# Patient Record
Sex: Female | Born: 1955 | Race: White | Hispanic: No | Smoking: Current every day smoker
Health system: Southern US, Community
[De-identification: ages and names within clinical notes are randomized; demographics above are authoritative.]

## PROBLEM LIST (undated history)

## (undated) DIAGNOSIS — Z972 Presence of dental prosthetic device (complete) (partial): Secondary | ICD-10-CM

## (undated) DIAGNOSIS — F338 Other recurrent depressive disorders: Secondary | ICD-10-CM

## (undated) DIAGNOSIS — M797 Fibromyalgia: Secondary | ICD-10-CM

## (undated) DIAGNOSIS — A6920 Lyme disease, unspecified: Secondary | ICD-10-CM

## (undated) DIAGNOSIS — R5382 Chronic fatigue, unspecified: Secondary | ICD-10-CM

## (undated) DIAGNOSIS — IMO0001 Reserved for inherently not codable concepts without codable children: Secondary | ICD-10-CM

## (undated) DIAGNOSIS — K219 Gastro-esophageal reflux disease without esophagitis: Secondary | ICD-10-CM

## (undated) DIAGNOSIS — K589 Irritable bowel syndrome without diarrhea: Secondary | ICD-10-CM

## (undated) DIAGNOSIS — M1990 Unspecified osteoarthritis, unspecified site: Secondary | ICD-10-CM

## (undated) DIAGNOSIS — E039 Hypothyroidism, unspecified: Secondary | ICD-10-CM

## (undated) DIAGNOSIS — F419 Anxiety disorder, unspecified: Secondary | ICD-10-CM

## (undated) HISTORY — PX: APPENDECTOMY: SHX54

## (undated) HISTORY — PX: BACK SURGERY: SHX140

## (undated) HISTORY — PX: BREAST SURGERY: SHX581

## (undated) HISTORY — PX: FOOT SURGERY: SHX648

---

## 1982-08-19 HISTORY — PX: ABDOMINAL HYSTERECTOMY: SHX81

## 2004-08-24 ENCOUNTER — Ambulatory Visit: Payer: Self-pay | Admitting: Otolaryngology

## 2004-08-29 ENCOUNTER — Ambulatory Visit: Payer: Self-pay | Admitting: Otolaryngology

## 2004-09-10 ENCOUNTER — Emergency Department: Payer: Self-pay | Admitting: Emergency Medicine

## 2004-09-28 ENCOUNTER — Emergency Department: Payer: Self-pay | Admitting: Unknown Physician Specialty

## 2004-10-04 ENCOUNTER — Inpatient Hospital Stay: Payer: Self-pay | Admitting: Gastroenterology

## 2005-02-13 ENCOUNTER — Ambulatory Visit: Payer: Self-pay | Admitting: Family Medicine

## 2005-07-31 ENCOUNTER — Emergency Department: Payer: Self-pay | Admitting: Emergency Medicine

## 2005-09-16 ENCOUNTER — Ambulatory Visit: Payer: Self-pay | Admitting: Gastroenterology

## 2005-09-30 ENCOUNTER — Ambulatory Visit: Payer: Self-pay | Admitting: Gastroenterology

## 2006-04-24 ENCOUNTER — Ambulatory Visit: Payer: Self-pay | Admitting: Nurse Practitioner

## 2006-06-26 ENCOUNTER — Ambulatory Visit: Payer: Self-pay | Admitting: Nurse Practitioner

## 2006-08-19 HISTORY — PX: SEPTOPLASTY: SUR1290

## 2007-05-07 ENCOUNTER — Ambulatory Visit: Payer: Self-pay | Admitting: Family Medicine

## 2007-05-19 ENCOUNTER — Emergency Department: Payer: Self-pay | Admitting: Emergency Medicine

## 2007-05-20 ENCOUNTER — Emergency Department: Payer: Self-pay | Admitting: Emergency Medicine

## 2007-05-21 ENCOUNTER — Ambulatory Visit: Payer: Self-pay | Admitting: *Deleted

## 2007-05-28 ENCOUNTER — Ambulatory Visit: Payer: Self-pay | Admitting: Unknown Physician Specialty

## 2008-10-20 ENCOUNTER — Ambulatory Visit: Payer: Self-pay | Admitting: Gastroenterology

## 2009-07-11 ENCOUNTER — Ambulatory Visit: Payer: Self-pay | Admitting: Internal Medicine

## 2009-11-08 ENCOUNTER — Emergency Department: Payer: Self-pay | Admitting: Emergency Medicine

## 2010-07-19 ENCOUNTER — Ambulatory Visit: Payer: Self-pay | Admitting: Internal Medicine

## 2010-10-17 ENCOUNTER — Ambulatory Visit: Payer: Self-pay | Admitting: Pain Medicine

## 2011-02-27 ENCOUNTER — Ambulatory Visit: Payer: Self-pay | Admitting: Otolaryngology

## 2011-03-12 ENCOUNTER — Ambulatory Visit: Payer: Self-pay | Admitting: Otolaryngology

## 2011-05-10 ENCOUNTER — Ambulatory Visit: Payer: Self-pay | Admitting: Gastroenterology

## 2011-08-06 ENCOUNTER — Ambulatory Visit: Payer: Self-pay | Admitting: Internal Medicine

## 2012-08-10 ENCOUNTER — Ambulatory Visit: Payer: Self-pay | Admitting: Internal Medicine

## 2012-08-13 ENCOUNTER — Ambulatory Visit: Payer: Self-pay | Admitting: Internal Medicine

## 2013-08-16 ENCOUNTER — Ambulatory Visit: Payer: Self-pay | Admitting: Internal Medicine

## 2013-09-29 ENCOUNTER — Emergency Department: Payer: Self-pay | Admitting: Emergency Medicine

## 2013-09-29 LAB — TROPONIN I: Troponin-I: <0.02 ng/mL

## 2013-09-29 LAB — CBC
HCT: 38.3 % (ref 35.0–47.0)
HGB: 13 g/dL (ref 12.0–16.0)
MCH: 30 pg (ref 26.0–34.0)
MCHC: 33.9 g/dL (ref 32.0–36.0)
MCV: 88 fL (ref 80–100)
Platelet: 228 10*3/uL (ref 150–440)
RBC: 4.34 10*6/uL (ref 3.80–5.20)
RDW: 13.3 % (ref 11.5–14.5)
WBC: 7.6 10*3/uL (ref 3.6–11.0)

## 2013-09-29 LAB — URINALYSIS, COMPLETE
BILIRUBIN, UR: NEGATIVE
Bacteria: NONE SEEN
Blood: NEGATIVE
Glucose,UR: NEGATIVE mg/dL (ref 0–75)
Ketone: NEGATIVE
LEUKOCYTE ESTERASE: NEGATIVE
Nitrite: NEGATIVE
Ph: 6 (ref 4.5–8.0)
Protein: NEGATIVE
RBC,UR: <1 /HPF (ref 0–5)
Specific Gravity: 1.002 (ref 1.003–1.030)
Squamous Epithelial: <1

## 2013-09-29 LAB — COMPREHENSIVE METABOLIC PANEL
ANION GAP: 4 — AB (ref 7–16)
Albumin: 4.2 g/dL (ref 3.4–5.0)
Alkaline Phosphatase: 70 U/L
BILIRUBIN TOTAL: 0.2 mg/dL (ref 0.2–1.0)
BUN: 7 mg/dL (ref 7–18)
CHLORIDE: 108 mmol/L — AB (ref 98–107)
CREATININE: 0.82 mg/dL (ref 0.60–1.30)
Calcium, Total: 8.9 mg/dL (ref 8.5–10.1)
Co2: 25 mmol/L (ref 21–32)
EGFR (African American): >60
EGFR (Non-African Amer.): >60
GLUCOSE: 93 mg/dL (ref 65–99)
Osmolality: 271 (ref 275–301)
Potassium: 3.5 mmol/L (ref 3.5–5.1)
SGOT(AST): 48 U/L — ABNORMAL HIGH (ref 15–37)
SGPT (ALT): 27 U/L (ref 12–78)
SODIUM: 137 mmol/L (ref 136–145)
TOTAL PROTEIN: 7.7 g/dL (ref 6.4–8.2)

## 2013-09-30 LAB — LIPASE, BLOOD: Lipase: 129 U/L (ref 73–393)

## 2014-08-23 ENCOUNTER — Ambulatory Visit: Payer: Self-pay | Admitting: Internal Medicine

## 2015-10-12 ENCOUNTER — Other Ambulatory Visit: Payer: Self-pay | Admitting: Otolaryngology

## 2015-10-12 DIAGNOSIS — E041 Nontoxic single thyroid nodule: Secondary | ICD-10-CM

## 2015-10-18 ENCOUNTER — Ambulatory Visit
Admission: RE | Admit: 2015-10-18 | Discharge: 2015-10-18 | Disposition: A | Discharge status: Home / Self Care | Payer: Medicare Other | Source: Ambulatory Visit | Attending: Otolaryngology | Admitting: Otolaryngology

## 2015-10-18 DIAGNOSIS — E041 Nontoxic single thyroid nodule: Secondary | ICD-10-CM

## 2015-10-23 ENCOUNTER — Encounter: Payer: Self-pay | Admitting: *Deleted

## 2015-10-31 ENCOUNTER — Ambulatory Visit
Admission: RE | Admit: 2015-10-31 | Discharge: 2015-10-31 | Disposition: A | Discharge status: Home / Self Care | Payer: Medicare Other | Source: Ambulatory Visit | Attending: Otolaryngology | Admitting: Otolaryngology

## 2015-10-31 ENCOUNTER — Ambulatory Visit: Payer: Medicare Other | Admitting: Anesthesiology

## 2015-10-31 ENCOUNTER — Encounter
Admission: RE | Disposition: A | Discharge status: Home / Self Care | Payer: Self-pay | Source: Ambulatory Visit | Attending: Otolaryngology

## 2015-10-31 DIAGNOSIS — Z79899 Other long term (current) drug therapy: Secondary | ICD-10-CM | POA: Diagnosis not present

## 2015-10-31 DIAGNOSIS — K219 Gastro-esophageal reflux disease without esophagitis: Secondary | ICD-10-CM | POA: Diagnosis not present

## 2015-10-31 DIAGNOSIS — Z888 Allergy status to other drugs, medicaments and biological substances status: Secondary | ICD-10-CM | POA: Insufficient documentation

## 2015-10-31 DIAGNOSIS — Z9889 Other specified postprocedural states: Secondary | ICD-10-CM | POA: Insufficient documentation

## 2015-10-31 DIAGNOSIS — Z90710 Acquired absence of both cervix and uterus: Secondary | ICD-10-CM | POA: Diagnosis not present

## 2015-10-31 DIAGNOSIS — F1920 Other psychoactive substance dependence, uncomplicated: Secondary | ICD-10-CM | POA: Insufficient documentation

## 2015-10-31 DIAGNOSIS — Z880 Allergy status to penicillin: Secondary | ICD-10-CM | POA: Diagnosis not present

## 2015-10-31 DIAGNOSIS — Z7951 Long term (current) use of inhaled steroids: Secondary | ICD-10-CM | POA: Insufficient documentation

## 2015-10-31 DIAGNOSIS — L720 Epidermal cyst: Secondary | ICD-10-CM | POA: Diagnosis not present

## 2015-10-31 DIAGNOSIS — J3501 Chronic tonsillitis: Secondary | ICD-10-CM | POA: Insufficient documentation

## 2015-10-31 DIAGNOSIS — J358 Other chronic diseases of tonsils and adenoids: Secondary | ICD-10-CM | POA: Insufficient documentation

## 2015-10-31 DIAGNOSIS — Z882 Allergy status to sulfonamides status: Secondary | ICD-10-CM | POA: Insufficient documentation

## 2015-10-31 DIAGNOSIS — M1990 Unspecified osteoarthritis, unspecified site: Secondary | ICD-10-CM | POA: Insufficient documentation

## 2015-10-31 DIAGNOSIS — M797 Fibromyalgia: Secondary | ICD-10-CM | POA: Diagnosis not present

## 2015-10-31 DIAGNOSIS — E079 Disorder of thyroid, unspecified: Secondary | ICD-10-CM | POA: Insufficient documentation

## 2015-10-31 DIAGNOSIS — Z9049 Acquired absence of other specified parts of digestive tract: Secondary | ICD-10-CM | POA: Diagnosis not present

## 2015-10-31 DIAGNOSIS — J351 Hypertrophy of tonsils: Secondary | ICD-10-CM | POA: Diagnosis not present

## 2015-10-31 DIAGNOSIS — K589 Irritable bowel syndrome without diarrhea: Secondary | ICD-10-CM | POA: Insufficient documentation

## 2015-10-31 DIAGNOSIS — Z886 Allergy status to analgesic agent status: Secondary | ICD-10-CM | POA: Insufficient documentation

## 2015-10-31 DIAGNOSIS — F17200 Nicotine dependence, unspecified, uncomplicated: Secondary | ICD-10-CM | POA: Diagnosis not present

## 2015-10-31 DIAGNOSIS — R5382 Chronic fatigue, unspecified: Secondary | ICD-10-CM | POA: Diagnosis not present

## 2015-10-31 DIAGNOSIS — Z79891 Long term (current) use of opiate analgesic: Secondary | ICD-10-CM | POA: Insufficient documentation

## 2015-10-31 DIAGNOSIS — F1020 Alcohol dependence, uncomplicated: Secondary | ICD-10-CM | POA: Diagnosis not present

## 2015-10-31 DIAGNOSIS — L723 Sebaceous cyst: Secondary | ICD-10-CM | POA: Diagnosis present

## 2015-10-31 HISTORY — DX: Unspecified osteoarthritis, unspecified site: M19.90

## 2015-10-31 HISTORY — DX: Lyme disease, unspecified: A69.20

## 2015-10-31 HISTORY — DX: Irritable bowel syndrome, unspecified: K58.9

## 2015-10-31 HISTORY — DX: Presence of dental prosthetic device (complete) (partial): Z97.2

## 2015-10-31 HISTORY — DX: Chronic fatigue, unspecified: R53.82

## 2015-10-31 HISTORY — DX: Reserved for inherently not codable concepts without codable children: IMO0001

## 2015-10-31 HISTORY — PX: PREAURICULAR CYST EXCISION: SHX2264

## 2015-10-31 HISTORY — DX: Gastro-esophageal reflux disease without esophagitis: K21.9

## 2015-10-31 HISTORY — DX: Fibromyalgia: M79.7

## 2015-10-31 HISTORY — PX: TONSILLECTOMY: SHX5217

## 2015-10-31 SURGERY — TONSILLECTOMY
Anesthesia: General | Wound class: Clean Contaminated

## 2015-10-31 MED ORDER — FENTANYL CITRATE (PF) 100 MCG/2ML IJ SOLN: 25.0000 ug | INTRAMUSCULAR | Status: DC | PRN

## 2015-10-31 MED ORDER — SUCCINYLCHOLINE CHLORIDE 20 MG/ML IJ SOLN
INTRAMUSCULAR | Status: DC | PRN
  Administered 2015-10-31: 100 mg via INTRAVENOUS

## 2015-10-31 MED ORDER — FENTANYL CITRATE (PF) 100 MCG/2ML IJ SOLN
INTRAMUSCULAR | Status: DC | PRN
  Administered 2015-10-31: 100 ug via INTRAVENOUS

## 2015-10-31 MED ORDER — ONDANSETRON HCL 4 MG/2ML IJ SOLN
INTRAMUSCULAR | Status: DC | PRN
  Administered 2015-10-31: 4 mg via INTRAVENOUS

## 2015-10-31 MED ORDER — PROPOFOL 10 MG/ML IV BOLUS
INTRAVENOUS | Status: DC | PRN
  Administered 2015-10-31: 200 mg via INTRAVENOUS

## 2015-10-31 MED ORDER — ACETAMINOPHEN 10 MG/ML IV SOLN
1000.0000 mg | Freq: Once | INTRAVENOUS | Status: AC
  Administered 2015-10-31: 1000 mg via INTRAVENOUS

## 2015-10-31 MED ORDER — OXYCODONE-ACETAMINOPHEN 5-325 MG/5ML PO SOLN: ORAL | Status: DC

## 2015-10-31 MED ORDER — GLYCOPYRROLATE 0.2 MG/ML IJ SOLN
INTRAMUSCULAR | Status: DC | PRN
  Administered 2015-10-31: 0.1 mg via INTRAVENOUS

## 2015-10-31 MED ORDER — LACTATED RINGERS IV SOLN
INTRAVENOUS | Status: DC
  Administered 2015-10-31: 11:00:00 via INTRAVENOUS

## 2015-10-31 MED ORDER — BUPIVACAINE-EPINEPHRINE 0.25% -1:200000 IJ SOLN
INTRAMUSCULAR | Status: DC | PRN
  Administered 2015-10-31: 2 mL

## 2015-10-31 MED ORDER — MIDAZOLAM HCL 5 MG/5ML IJ SOLN
INTRAMUSCULAR | Status: DC | PRN
  Administered 2015-10-31: 2 mg via INTRAVENOUS

## 2015-10-31 MED ORDER — OXYCODONE HCL 5 MG/5ML PO SOLN
5.0000 mg | Freq: Once | ORAL | Status: AC | PRN
  Administered 2015-10-31: 5 mg via ORAL

## 2015-10-31 MED ORDER — PREDNISOLONE SODIUM PHOSPHATE 15 MG/5ML PO SOLN: ORAL | Status: DC

## 2015-10-31 MED ORDER — LIDOCAINE HCL (CARDIAC) 20 MG/ML IV SOLN
INTRAVENOUS | Status: DC | PRN
  Administered 2015-10-31: 40 mg via INTRAVENOUS

## 2015-10-31 MED ORDER — BACIT-POLY-NEO HC 1 % EX OINT
TOPICAL_OINTMENT | CUTANEOUS | Status: DC | PRN
  Administered 2015-10-31: 1 via TOPICAL

## 2015-10-31 MED ORDER — OXYCODONE HCL 5 MG PO TABS: 5.0000 mg | ORAL_TABLET | Freq: Once | ORAL | Status: AC | PRN

## 2015-10-31 MED ORDER — ONDANSETRON HCL 4 MG/2ML IJ SOLN
4.0000 mg | Freq: Once | INTRAMUSCULAR | Status: AC | PRN
  Administered 2015-10-31: 4 mg via INTRAVENOUS

## 2015-10-31 MED ORDER — LIDOCAINE HCL (PF) 1 % IJ SOLN
INTRAMUSCULAR | Status: DC | PRN
  Administered 2015-10-31: 1 mL

## 2015-10-31 MED ORDER — DEXAMETHASONE SODIUM PHOSPHATE 4 MG/ML IJ SOLN
INTRAMUSCULAR | Status: DC | PRN
  Administered 2015-10-31: 8 mg via INTRAVENOUS

## 2015-10-31 SURGICAL SUPPLY — 37 items
APPLICATOR COTTON TIP WD 3 STR (MISCELLANEOUS) IMPLANT
BLADE SURG 15 STRL LF DISP TIS (BLADE) IMPLANT
BLADE SURG 15 STRL SS (BLADE)
CANISTER SUCT 1200ML W/VALVE (MISCELLANEOUS) ×4 IMPLANT
CATH ROBINSON RED A/P 10FR (CATHETERS) ×4 IMPLANT
COAG SUCT 10F 3.5MM HAND CTRL (MISCELLANEOUS) ×4 IMPLANT
CORD BIP STRL DISP 12FT (MISCELLANEOUS) ×4 IMPLANT
DECANTER SPIKE VIAL GLASS SM (MISCELLANEOUS) ×4 IMPLANT
DRAPE HEAD BAR (DRAPES) IMPLANT
DRESSING TELFA 4X3 1S ST N-ADH (GAUZE/BANDAGES/DRESSINGS) ×4 IMPLANT
ELECT CAUTERY BLADE TIP 2.5 (TIP) ×4
ELECT CAUTERY NEEDLE 2.0 MIC (NEEDLE) IMPLANT
ELECTRODE CAUTERY BLDE TIP 2.5 (TIP) ×2 IMPLANT
GAUZE SPONGE 4X4 12PLY STRL (GAUZE/BANDAGES/DRESSINGS) IMPLANT
GLOVE BIO SURGEON STRL SZ7.5 (GLOVE) ×8 IMPLANT
KIT ROOM TURNOVER OR (KITS) ×4 IMPLANT
LIQUID BAND (GAUZE/BANDAGES/DRESSINGS) IMPLANT
NEEDLE HYPO 25GX1X1/2 BEV (NEEDLE) ×4 IMPLANT
NS IRRIG 500ML POUR BTL (IV SOLUTION) ×4 IMPLANT
PACK DRAPE NASAL/ENT (PACKS) ×4 IMPLANT
PACK TONSIL/ADENOIDS (PACKS) ×4 IMPLANT
PAD GROUND ADULT SPLIT (MISCELLANEOUS) ×4 IMPLANT
PENCIL ELECTRO HAND CTR (MISCELLANEOUS) ×4 IMPLANT
SOL ANTI-FOG 6CC FOG-OUT (MISCELLANEOUS) ×2 IMPLANT
SOL FOG-OUT ANTI-FOG 6CC (MISCELLANEOUS) ×2
SOL PREP PVP 2OZ (MISCELLANEOUS)
SOLUTION PREP PVP 2OZ (MISCELLANEOUS) IMPLANT
STRAP BODY AND KNEE 60X3 (MISCELLANEOUS) ×4 IMPLANT
SUCTION FRAZIER TIP 10 FR DISP (SUCTIONS) IMPLANT
SUT PLAIN GUT FAST 5-0 (SUTURE) ×4 IMPLANT
SUT PROLENE 5 0 P 3 (SUTURE) IMPLANT
SUT VIC AB 4-0 RB1 27 (SUTURE)
SUT VIC AB 4-0 RB1 27X BRD (SUTURE) IMPLANT
SUT VIC AB 5-0 P-3 18X BRD (SUTURE) ×2 IMPLANT
SUT VIC AB 5-0 P3 18 (SUTURE) ×2
SYRINGE 10CC LL (SYRINGE) ×4 IMPLANT
TOWEL OR 17X26 4PK STRL BLUE (TOWEL DISPOSABLE) ×4 IMPLANT

## 2015-10-31 NOTE — H&P (Signed)
History and physical reviewed and will be scanned in later. No change in medical status reported by the patient or family, appears stable for surgery. All questions regarding the procedure answered, and patient (or family if a child) expressed understanding of the procedure.  Judy Salinas S @TODAY@ 

## 2015-10-31 NOTE — Anesthesia Preprocedure Evaluation (Signed)
Anesthesia Evaluation  Patient identified by MRN, date of birth, ID band Patient awake    Reviewed: Allergy & Precautions, H&P , NPO status   Airway Mallampati: II  TM Distance: >3 FB Neck ROM: full    Dental   Pulmonary shortness of breath, Current Smoker,     + wheezing      Cardiovascular Normal cardiovascular exam     Neuro/Psych PSYCHIATRIC DISORDERS  Neuromuscular disease    GI/Hepatic GERD  ,  Endo/Other    Renal/GU      Musculoskeletal   Abdominal   Peds  Hematology   Anesthesia Other Findings   Reproductive/Obstetrics                             Anesthesia Physical Anesthesia Plan  ASA: III  Anesthesia Plan: General ETT   Post-op Pain Management:    Induction:   Airway Management Planned:   Additional Equipment:   Intra-op Plan:   Post-operative Plan:   Informed Consent: I have reviewed the patients History and Physical, chart, labs and discussed the procedure including the risks, benefits and alternatives for the proposed anesthesia with the patient or authorized representative who has indicated his/her understanding and acceptance.     Plan Discussed with: CRNA  Anesthesia Plan Comments:         Anesthesia Quick Evaluation

## 2015-10-31 NOTE — Discharge Instructions (Signed)
T & A INSTRUCTION SHEET - [facility] °[facility] ° °CREIGHTON VAUGHT, MD °PAUL H. JUENGEL, MD  °P. SCOTT BENNETT °CHAPMAN MCQUEEN, MD ° °[address] [place], [place] [address] TEL. [phone] °[address] [place] [address] [phone] ° °INFORMATION SHEET FOR A TONSILLECTOMY AND ADENDOIDECTOMY ° °About Your Tonsils and Adenoids ° The tonsils and adenoids are normal body tissues that are part of our immune system.  They normally help to protect us against diseases that may enter our mouth and nose.  However, sometimes the tonsils and/or adenoids become too large and obstruct our breathing, especially at night. °  ° If either of these things happen it helps to remove the tonsils and adenoids in order to become healthier. The operation to remove the tonsils and adenoids is called a tonsillectomy and adenoidectomy. ° °The Location of Your Tonsils and Adenoids ° The tonsils are located in the back of the throat on both side and sit in a cradle of muscles. The adenoids are located in the roof of the mouth, behind the nose, and closely associated with the opening of the Eustachian tube to the ear. ° °Surgery on Tonsils and Adenoids ° A tonsillectomy and adenoidectomy is a short operation which takes about thirty minutes.  This includes being put to sleep and being awakened.  Tonsillectomies and adenoidectomies are performed at [facility] and may require observation period in the recovery room prior to going home. ° °Following the Operation for a Tonsillectomy ° A cautery machine is used to control bleeding.  Bleeding from a tonsillectomy and adenoidectomy is minimal and postoperatively the risk of bleeding is approximately four percent, although this rarely life threatening. ° ° ° °After your tonsillectomy and adenoidectomy post-op care at home: ° °1. Our patients are able to go home the same day.  You may be given prescriptions for pain  medications and antibiotics, if indicated. °2. It is extremely important to remember that fluid intake is of utmost importance after a tonsillectomy.  The amount that you drink must be maintained in the postoperative period.  A good indication of whether a child is getting enough fluid is whether his/her urine output is constant.  As long as children are urinating or wetting their diaper every 6 - 8 hours this is usually enough fluid intake.   °3. Although rare, this is a risk of some bleeding in the first ten days after surgery.  This is usually occurs between day five and nine postoperatively.  This risk of bleeding is approximately four percent.  If you or your child should have any bleeding you should remain calm and notify our office or go directly to the Emergency Room at [facility] where they will contact us. Our doctors are available seven days a week for notification.  We recommend sitting up quietly in a chair, place an ice pack on the front of the neck and spitting out the blood gently until we are able to contact you.  Adults should gargle gently with ice water and this may help stop the bleeding.  If the bleeding does not stop after a short time, i.e. 10 to 15 minutes, or seems to be increasing again, please contact us or go to the hospital.   °4. It is common for the pain to be worse at 5 - 7 days postoperatively.  This occurs because the “scab” is peeling off and the mucous membrane (skin of   the throat) is growing back where the tonsils were.   °5. It is common for a low-grade fever, less than 102, during the first week after a tonsillectomy and adenoidectomy.  It is usually due to not drinking enough liquids, and we suggest your use liquid Tylenol or the pain medicine with Tylenol prescribed in order to keep your temperature below 102.  Please follow the directions on the back of the bottle. °6. Do not take aspirin or any products that contain aspirin such as Bufferin, Anacin,  Ecotrin, aspirin gum, Goodies, BC headache powders, etc., after a T&A because it can promote bleeding.  Please check with our office before administering any other medication that may been prescribed by other doctors during the two week post-operative period. °7. If you happen to look in the mirror or into your child’s mouth you will see white/gray patches on the back of the throat.  This is what a scab looks like in the mouth and is normal after having a T&A.  It will disappear once the tonsil area heals completely. However, it may cause a noticeable odor, and this too will disappear with time.     °8. You or your child may experience ear pain after having a T&A.  This is called referred pain and comes from the throat, but it is felt in the ears.  Ear pain is quite common and expected.  It will usually go away after ten days.  There is usually nothing wrong with the ears, and it is primarily due to the healing area stimulating the nerve to the ear that runs along the side of the throat.  Use either the prescribed pain medicine or Tylenol as needed.  °9. The throat tissues after a tonsillectomy are obviously sensitive.  Smoking around children who have had a tonsillectomy significantly increases the risk of bleeding.  DO NOT SMOKE!  ° °General Anesthesia, Adult, Care After °Refer to this sheet in the next few weeks. These instructions provide you with information on caring for yourself after your procedure. Your health care provider may also give you more specific instructions. Your treatment has been planned according to current medical practices, but problems sometimes occur. Call your health care provider if you have any problems or questions after your procedure. °WHAT TO EXPECT AFTER THE PROCEDURE °After the procedure, it is typical to experience: °· Sleepiness. °· Nausea and vomiting. °HOME CARE INSTRUCTIONS °· For the first 24 hours after general anesthesia: °¨ Have a responsible person with you. °¨ Do not  drive a car. If you are alone, do not take public transportation. °¨ Do not drink alcohol. °¨ Do not take medicine that has not been prescribed by your health care provider. °¨ Do not sign important papers or make important decisions. °¨ You may resume a normal diet and activities as directed by your health care provider. °· Change bandages (dressings) as directed. °· If you have questions or problems that seem related to general anesthesia, call the hospital and ask for the anesthetist or anesthesiologist on call. °SEEK MEDICAL CARE IF: °· You have nausea and vomiting that continue the day after anesthesia. °· You develop a rash. °SEEK IMMEDIATE MEDICAL CARE IF:  °· You have difficulty breathing. °· You have chest pain. °· You have any allergic problems. °  °This information is not intended to replace advice given to you by your health care provider. Make sure you discuss any questions you have with your health care provider. °  °Document   Released: 11/11/2000 Document Revised: 08/26/2014 Document Reviewed: 12/04/2011 °Elsevier Interactive Patient Education ©2016 [employer] ° °

## 2015-10-31 NOTE — Op Note (Signed)
10/31/2015  1:29 PM    Judy Salinas  [id number]   Pre-Op Diagnosis:  CHRONIC TONSILLITIS, TONSILLITHIASIS, ASYMMETRIC TONSIL HYPERTROPHY, LEFT POST AURICULAR CYST  Post-op Diagnosis: SAME  Procedure: 1) Tonsillectomy 2) excision of 7 mm left posterior auricular sebaceous cyst with intermediate closure (1.5 cm)  Surgeon:  Riley Nearing  Anesthesia:  General endotracheal  EBL:  Less than 25 cc  Complications:  None  Findings: 7 mm sebaceous cyst involving the left postauricular skin on the auricle. 2+ cryptic tonsils with the left tonsil asymmetrically enlarged compared to the right. Bilateral tonsillith debris.  Procedure: The patient was taken to the Operating Room and placed in the supine position.  After induction of general endotracheal anesthesia, the table was turned 90 degrees and the left ear was prepped and draped in usual sterile fashion. 1% lidocaine with epinephrine 1 100,000 was injected in the skin on the posterior aspect of the left auricle around the cyst. A 15 blade was then used to incise an ellipse around the cyst, and the cyst excised completely using tenotomy's. Minor bleeding was controlled with bipolar cautery. The wound was then closed in a layered fashion with 5-0 Vicryl suture for the deep closure and 5-0 fast absorbing gut suture in a running locked stitch for the skin. Triple antibiotic ointment was then applied.   Next a mouth gag was inserted into the oral cavity to open the mouth, and examination of the oropharynx showed the uvula was non-bifid. The palate was palpated, and there was no evidence of submucous cleft. Examination of the nasopharynx showed no obstructing adenoids. The right tonsil was grasped with an Allis clamp and resected from the tonsillar fossa in the usual fashion with the Bovie. The left tonsil was resected in the same fashion. The Bovie was used to obtain hemostasis. Each tonsillar fossa was then carefully injected with 0.25%  marcaine with epinephrine, 1:200,000, avoiding intravascular injection. The nose and throat were irrigated and suctioned to remove any blood clot. The mouth gag was  removed with no evidence of active bleeding.  The patient was then returned to the anesthesiologist for awakening, and was taken to the Recovery Room in stable condition.  Cultures:  None.  Specimens:  Tonsils.  Disposition:   PACU to home  Plan: Soft, bland diet and push fluids. Take pain medications and antibiotics as prescribed. Antibiotic ointment to postauricular wound twice daily for 5 days. No strenuous activity for 2 weeks. Follow-up in 3 weeks.  Riley Nearing 10/31/2015 1:29 PM

## 2015-10-31 NOTE — Transfer of Care (Signed)
Immediate Anesthesia Transfer of Care Note  Patient: Judy Salinas  Procedure(s) Performed: Procedure(s): TONSILLECTOMY (N/A) EXCISION POST AURICULAR CYST ADULT (Left)  Patient Location: PACU  Anesthesia Type: General ETT  Level of Consciousness: awake, alert  and patient cooperative  Airway and Oxygen Therapy: Patient Spontanous Breathing and Patient connected to supplemental oxygen  Post-op Assessment: Post-op Vital signs reviewed, Patient's Cardiovascular Status Stable, Respiratory Function Stable, Patent Airway and No signs of Nausea or vomiting  Post-op Vital Signs: Reviewed and stable  Complications: No apparent anesthesia complications

## 2015-10-31 NOTE — Anesthesia Procedure Notes (Signed)
Procedure Name: Intubation Date/Time: 10/31/2015 12:44 PM Performed by: Londell Moh Pre-anesthesia Checklist: Patient identified, Emergency Drugs available, Suction available, Patient being monitored and Timeout performed Patient Re-evaluated:Patient Re-evaluated prior to inductionOxygen Delivery Method: Circle system utilized Preoxygenation: Pre-oxygenation with 100% oxygen Intubation Type: IV induction Ventilation: Mask ventilation without difficulty Laryngoscope Size: Mac and 3 Grade View: Grade I Tube type: Oral Rae Tube size: 7.0 mm Number of attempts: 1 Placement Confirmation: ETT inserted through vocal cords under direct vision,  positive ETCO2 and breath sounds checked- equal and bilateral Tube secured with: Tape Dental Injury: Teeth and Oropharynx as per pre-operative assessment

## 2015-10-31 NOTE — Anesthesia Postprocedure Evaluation (Signed)
Anesthesia Post Note  Patient: Judy Salinas  Procedure(s) Performed: Procedure(s) (LRB): TONSILLECTOMY (N/A) EXCISION POST AURICULAR CYST ADULT (Left)  Patient location during evaluation: PACU Anesthesia Type: General Level of consciousness: awake and alert Pain management: pain level controlled Vital Signs Assessment: post-procedure vital signs reviewed and stable Respiratory status: spontaneous breathing, nonlabored ventilation, respiratory function stable and patient connected to nasal cannula oxygen Cardiovascular status: blood pressure returned to baseline and stable Postop Assessment: no signs of nausea or vomiting Anesthetic complications: no    Amaryllis Dyke

## 2015-11-01 ENCOUNTER — Encounter: Payer: Self-pay | Admitting: Otolaryngology

## 2015-11-02 LAB — SURGICAL PATHOLOGY

## 2016-04-15 ENCOUNTER — Ambulatory Visit (INDEPENDENT_AMBULATORY_CARE_PROVIDER_SITE_OTHER): Payer: Medicare Other | Admitting: Podiatry

## 2016-04-15 ENCOUNTER — Ambulatory Visit: Payer: Medicare Other

## 2016-04-15 ENCOUNTER — Ambulatory Visit (INDEPENDENT_AMBULATORY_CARE_PROVIDER_SITE_OTHER): Payer: Medicare Other

## 2016-04-15 ENCOUNTER — Encounter: Payer: Self-pay | Admitting: Podiatry

## 2016-04-15 DIAGNOSIS — M722 Plantar fascial fibromatosis: Secondary | ICD-10-CM

## 2016-04-15 NOTE — Progress Notes (Signed)
   Subjective:    Patient ID: Judy Salinas, female    DOB: Dec 18, 1955, 61 y.o.   MRN: [id number]  HPI: She presents today for a chief complaint of pain to the plantar medial aspect of the left heel she states that she walks about 5 miles a day. She is also complaining of pain to the second metatarsophalangeal joint bilaterally. The left foot we performed surgery on in this second toe was slightly contracted.    Review of Systems  HENT: Positive for ear pain and tinnitus.   Eyes: Positive for itching.  Gastrointestinal: Positive for abdominal distention, abdominal pain, constipation, diarrhea, nausea and vomiting.  Endocrine: Positive for cold intolerance.  Musculoskeletal: Positive for arthralgias, back pain, gait problem and myalgias.  Neurological: Positive for weakness.  Hematological: Bruises/bleeds easily.  All other systems reviewed and are negative.      Objective:   Physical Exam: Vital signs are stable alert and oriented 3 pulses are strongly palpable. Neurologic sensorium is intact to reflex are intact muscle strength is normal symmetrical bilateral. The evaluation demonstrates pain on end range of motion of the second metatarsophalangeal joints bilateral with contracture deformity of the skin and of the joint at the second metatarsophalangeal joint of the left foot secondary to surgery. Radiographs of the bilateral feet demonstrate soft tissue increase in density at the plantar fascia calcaneal insertion site of the left heel with internal fixations versus second metatarsal left foot. Capsulitis second metatarsophalangeal joint right foot. No fractures are identified.        Assessment & Plan:  Capsulitis second metatarsophalangeal joint bilateral plantar fasciitis left heel.  Plan: I injected the left heel today with Kenalog and local anesthetic injected second metatarsophalangeal joints after sterile Betadine skin prep dexamethasone and local anesthetic. Wrote  prescription for anti-inflammatory gel will follow-up with her in 1 month.

## 2016-09-04 ENCOUNTER — Ambulatory Visit: Payer: Medicare Other | Admitting: Podiatry

## 2016-09-16 ENCOUNTER — Ambulatory Visit (INDEPENDENT_AMBULATORY_CARE_PROVIDER_SITE_OTHER): Payer: Medicare Other

## 2016-09-16 ENCOUNTER — Ambulatory Visit (INDEPENDENT_AMBULATORY_CARE_PROVIDER_SITE_OTHER): Payer: Medicare Other | Admitting: Podiatry

## 2016-09-16 ENCOUNTER — Encounter: Payer: Self-pay | Admitting: Podiatry

## 2016-09-16 DIAGNOSIS — M2042 Other hammer toe(s) (acquired), left foot: Secondary | ICD-10-CM

## 2016-09-16 DIAGNOSIS — M722 Plantar fascial fibromatosis: Secondary | ICD-10-CM

## 2016-09-16 DIAGNOSIS — M778 Other enthesopathies, not elsewhere classified: Secondary | ICD-10-CM

## 2016-09-16 DIAGNOSIS — M7751 Other enthesopathy of right foot: Secondary | ICD-10-CM

## 2016-09-16 DIAGNOSIS — M779 Enthesopathy, unspecified: Principal | ICD-10-CM

## 2016-09-16 DIAGNOSIS — M2012 Hallux valgus (acquired), left foot: Secondary | ICD-10-CM | POA: Diagnosis not present

## 2016-09-16 NOTE — Progress Notes (Signed)
She presents today with a chief complaint of a painful second metatarsophalangeal joint of the left foot as well as a painful nodule to the plantar aspect of the right foot. She states that the nodule was much larger has since decreased in size and severity of pain. She states is still tender with pressure.  She relates having had surgery done by me approximately 10 years ago for bunion repair. We also worked on the second metatarsophalangeal joint and eventually remove the screw from the joint. She states that the toe is starting to sit up now and move over the big toe. She denies any changes in her past medical history medications or allergies she has an extensive allergy list of a proximally 14 allergies.  Objective: Vital signs are stable alert and oriented 3 I have reviewed her past medical history medications allergy surgery social history and review of systems. Pulses are strongly palpable. Neurologic sensorium is intact. She has a large nonpulsatile mass measuring 7 m in diameter medial band of plantar fascia consistent with a plantar fibroma. Radiographs of this lesion taken today do not demonstrate any calcification and normal osseous architecture otherwise this foot. Left foot does demonstrate pain on range of motion and on attempted range of motion of the second metatarsophalangeal joint of the left foot with a mild hallux interphalangeal. 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 There is no internal fixation remaining.  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 She also has plantar fibroma of the right foot.  Plan: We went over a consent form today line by line number by number giving her ample time to ask questions she saw fit regarding an Akin osteotomy a release of the second metatarsophalangeal joint hammertoe repair with pinning of the toe left and  injection of the plantar fibroma right foot. She understands this is amenable to it we did discuss the possible postop complications which may include but are not limited to postop pain bleeding swelling infection recurrence need for further surgery overcorrection under correction. She understands this is amenable to it and will follow up with me in the near future's. We did discuss the need for surgical intervention we also discussed surgery center and anesthesia. We dispensed a cam walker today.

## 2016-09-16 NOTE — Patient Instructions (Signed)
Pre-Operative Instructions  Congratulations, you have decided to take an important step to improving your quality of life.  You can be assured that the doctors of [facility] will be with you every step of the way.  1. Plan to be at the surgery center/hospital at least 1 (one) hour prior to your scheduled time unless otherwise directed by the surgical center/hospital staff.  You must have a responsible adult accompany you, remain during the surgery and drive you home.  Make sure you have directions to the surgical center/hospital and know how to get there on time. 2. For hospital based surgery you will need to obtain a history and physical form from your family physician within 1 month prior to the date of surgery- we will give you a form for you primary physician.  3. We make every effort to accommodate the date you request for surgery.  There are however, times where surgery dates or times have to be moved.  We will contact you as soon as possible if a change in schedule is required.   4. No Aspirin/Ibuprofen for one week before surgery.  If you are on aspirin, any non-steroidal anti-inflammatory medications (Mobic, Aleve, Ibuprofen) you should stop taking it 7 days prior to your surgery.  You make take Tylenol  For pain prior to surgery.  5. Medications- If you are taking daily heart and blood pressure medications, seizure, reflux, allergy, asthma, anxiety, pain or diabetes medications, make sure the surgery center/hospital is aware before the day of surgery so they may notify you which medications to take or avoid the day of surgery. 6. No food or drink after midnight the night before surgery unless directed otherwise by surgical center/hospital staff. 7. No alcoholic beverages 24 hours prior to surgery.  No smoking 24 hours prior to or 24 hours after surgery. 8. Wear loose pants or shorts- loose enough to fit over bandages, boots, and casts. 9. No slip on shoes, sneakers are best. 10. Bring  your boot with you to the surgery center/hospital.  Also bring crutches or a walker if your physician has prescribed it for you.  If you do not have this equipment, it will be provided for you after surgery. 11. If you have not been contracted by the surgery center/hospital by the day before your surgery, call to confirm the date and time of your surgery. 12. Leave-time from work may vary depending on the type of surgery you have.  Appropriate arrangements should be made prior to surgery with your employer. 13. Prescriptions will be provided immediately following surgery by your doctor.  Have these filled as soon as possible after surgery and take the medication as directed. 14. Remove nail polish on the operative foot. 15. Wash the night before surgery.  The night before surgery wash the foot and leg well with the antibacterial soap provided and water paying special attention to beneath the toenails and in between the toes.  Rinse thoroughly with water and dry well with a towel.  Perform this wash unless told not to do so by your physician.  Enclosed: 1 Ice pack (please put in freezer the night before surgery)   1 Hibiclens skin cleaner   Pre-op Instructions  If you have any questions regarding the instructions, do not hesitate to call our office.  [place]: [address] [place], [place] [address] [phone]  [place]: [address] [place], [place] [address] [phone]  [place]: [address]  [place], [place] [address] [phone]   Dr.   Norman Regal DPM, Dr. Matthew Wagoner DPM, Dr. M. Todd Kerstie Agent DPM, Dr. Titorya Stover DPM 

## 2016-09-17 ENCOUNTER — Telehealth: Payer: Self-pay | Admitting: *Deleted

## 2016-09-17 NOTE — Telephone Encounter (Signed)
"  Dr. Milinda Pointer wants me schedule an appointment for surgery."

## 2016-09-19 NOTE — Telephone Encounter (Signed)
I am returning your call.  You want to schedule surgery?  Dr. Stephenie Acres next available surgery date is 10/18/2016.  "I have to wait that long?  Go ahead and put me down.  I want to do it early morning."  I will get it scheduled.  Someone from the surgical center will call you with the arrival time a day or two prior to the surgery date.  The reason I can't give you a time is because they may have cancellations or they may have a patient that is a child or diabetic that needs to have surgery first.  "I understand."

## 2016-10-16 ENCOUNTER — Other Ambulatory Visit: Payer: Self-pay | Admitting: Podiatry

## 2016-10-16 MED ORDER — MEPERIDINE HCL 50 MG PO TABS: ORAL_TABLET | ORAL | 0 refills | Status: DC

## 2016-10-16 MED ORDER — CLINDAMYCIN HCL 150 MG PO CAPS: 150.0000 mg | ORAL_CAPSULE | Freq: Three times a day (TID) | ORAL | 0 refills | Status: DC

## 2016-10-16 MED ORDER — ONDANSETRON HCL 4 MG PO TABS: 4.0000 mg | ORAL_TABLET | Freq: Three times a day (TID) | ORAL | 0 refills | Status: DC | PRN

## 2016-10-18 ENCOUNTER — Encounter: Payer: Self-pay | Admitting: Podiatry

## 2016-10-18 DIAGNOSIS — M2042 Other hammer toe(s) (acquired), left foot: Secondary | ICD-10-CM | POA: Diagnosis not present

## 2016-10-18 DIAGNOSIS — M722 Plantar fascial fibromatosis: Secondary | ICD-10-CM | POA: Diagnosis not present

## 2016-10-18 DIAGNOSIS — M2012 Hallux valgus (acquired), left foot: Secondary | ICD-10-CM | POA: Diagnosis not present

## 2016-10-22 ENCOUNTER — Encounter: Payer: Self-pay | Admitting: Sports Medicine

## 2016-10-22 NOTE — Postoperative (Signed)
Patient called stating that she feels warm but has no way to check for fever and has headache with pain pill and nausea. I advised Tylenol and to stay hydrated to prevent against headache. Advised patient to take Zofran for nausea and if no better go to ER. Patient expressed understanding.  -Dr. Cannon Kettle

## 2016-10-23 ENCOUNTER — Ambulatory Visit (INDEPENDENT_AMBULATORY_CARE_PROVIDER_SITE_OTHER): Payer: Medicare Other | Admitting: Podiatry

## 2016-10-23 ENCOUNTER — Ambulatory Visit (INDEPENDENT_AMBULATORY_CARE_PROVIDER_SITE_OTHER): Payer: Medicare Other

## 2016-10-23 VITALS — Temp 98.3°F

## 2016-10-23 DIAGNOSIS — M2012 Hallux valgus (acquired), left foot: Secondary | ICD-10-CM | POA: Diagnosis not present

## 2016-10-23 DIAGNOSIS — M722 Plantar fascial fibromatosis: Secondary | ICD-10-CM

## 2016-10-23 DIAGNOSIS — M2042 Other hammer toe(s) (acquired), left foot: Secondary | ICD-10-CM

## 2016-10-23 MED ORDER — FLUCONAZOLE 150 MG PO TABS: 150.0000 mg | ORAL_TABLET | Freq: Once | ORAL | 1 refills | Status: AC

## 2016-10-23 MED ORDER — ACETAMINOPHEN-CODEINE #3 300-30 MG PO TABS: 1.0000 | ORAL_TABLET | Freq: Four times a day (QID) | ORAL | 0 refills | Status: DC | PRN

## 2016-10-23 NOTE — Progress Notes (Signed)
She presents today for her first postop visit she is status post Akin osteotomy and hammertoe repair of release of the first metatarsophalangeal joint left foot. She states that she's had a problem taking her medication as it made her stomach upset and she also had a problem taking her antibiotic causing a yeast infection. States that she's had pain for the last couple of nights.  Objective: Vital signs are stable alert and oriented 3. Pulses are palpable. Neurologic services intact. Degenerative flexor intact. Muscle strength is 5 over 5 dorsiflexion plantar flexors and inverters everters alters musculatures intact. Orthopedic evaluation demonstrates K wires intact toe is rectus no erythema no cellulitis drainage or odor sutures are in place margins are well coapted radiographs demonstrate well-placed Akin osteotomy with screw fixation.  Assessment: Well-healing surgical foot left.  Plan: Redressed today dresser compressive dressing changed her medication to Tylenol with Codeine and provided her a prescription for Diflucan I will follow-up with her in a little more than a week.

## 2016-10-28 ENCOUNTER — Telehealth: Payer: Self-pay | Admitting: Podiatry

## 2016-10-28 NOTE — Telephone Encounter (Signed)
Patient wants to know if she is restricted to not being weight bearing on the surgical foot, and also wants to know if she can take a shower?  Wants a nurse to call her.

## 2016-10-28 NOTE — Telephone Encounter (Signed)
I informed she shouldn't be up on her foot more than 15 minutes/hour and that showering would be difficult because the boot was slick once in a bag and the bag wet. Pt states she lives alone, and I told her I did not recommend showering without assistance.

## 2016-10-30 ENCOUNTER — Encounter: Payer: Medicare Other | Admitting: Podiatry

## 2016-11-04 ENCOUNTER — Encounter: Payer: Self-pay | Admitting: Podiatry

## 2016-11-04 ENCOUNTER — Ambulatory Visit (INDEPENDENT_AMBULATORY_CARE_PROVIDER_SITE_OTHER): Payer: Medicare Other

## 2016-11-04 ENCOUNTER — Ambulatory Visit (INDEPENDENT_AMBULATORY_CARE_PROVIDER_SITE_OTHER): Payer: Self-pay | Admitting: Podiatry

## 2016-11-04 DIAGNOSIS — M2042 Other hammer toe(s) (acquired), left foot: Secondary | ICD-10-CM | POA: Diagnosis not present

## 2016-11-04 DIAGNOSIS — M722 Plantar fascial fibromatosis: Secondary | ICD-10-CM

## 2016-11-04 DIAGNOSIS — M2012 Hallux valgus (acquired), left foot: Secondary | ICD-10-CM

## 2016-11-04 NOTE — Progress Notes (Signed)
She presents today date of surgery 10/18/2016 status post Akin osteotomy right foot hammertoe repair right foot with pin and injected left foot.  Objective: Vital signs are stable alert and oriented 3. Pulses are palpable. Sutures are intact left foot but do not demonstrate that they are ready to come out. The second toe does demonstrate what appears to be an eschar over the distal aspect that has started to delineate quite nicely. 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  Assessment: Well-healing surgical foot.  Plan: I will follow-up with her in 1 week for suture removal. We will not remove the pin until 6 weeks out at least.

## 2016-11-11 ENCOUNTER — Ambulatory Visit (INDEPENDENT_AMBULATORY_CARE_PROVIDER_SITE_OTHER): Payer: Self-pay | Admitting: Podiatry

## 2016-11-11 ENCOUNTER — Ambulatory Visit: Payer: Medicare Other

## 2016-11-11 ENCOUNTER — Encounter: Payer: Self-pay | Admitting: Podiatry

## 2016-11-11 DIAGNOSIS — M2042 Other hammer toe(s) (acquired), left foot: Secondary | ICD-10-CM

## 2016-11-11 DIAGNOSIS — M2012 Hallux valgus (acquired), left foot: Secondary | ICD-10-CM

## 2016-11-11 DIAGNOSIS — M722 Plantar fascial fibromatosis: Secondary | ICD-10-CM

## 2016-11-11 NOTE — Progress Notes (Signed)
She presents today for follow-up of her surgery date of surgery 10/18/2016 status post Akin osteotomy hammertoe repair #2 #3 of the left foot. She states this seems to be doing better but I'll need to have these pins removed. As she refers to the pin in the second toe. She states that she continues to smoke a regular mallet has not decreased smoking at all.  Objective: Vital signs are stable she is alert and oriented 3 pulses remain palpable to the left foot however the second digit still does demonstrate a eschar which appears to be delineating to the distal aspect of the toe there is no fluid within the toe however I did go ahead and remove the pin today is my better judgment secondary to the eschar primarily. I feel we might not be able to maintain her correction however I do not want to run the risk of her losing her toe due to the vasospasm that appears to be occurring. There was bleeding from the toe today when the K wire was removed. No purulence no malodor from the pin tract.  Assessment: Well-healing surgical foot.  Plan: K wire was pulled from the second toe today encouraged her to wear compression anklet and a Darco shoe these were dispensed for her today and I will follow-up with her in 2 weeks.

## 2016-11-27 ENCOUNTER — Ambulatory Visit (INDEPENDENT_AMBULATORY_CARE_PROVIDER_SITE_OTHER): Payer: Medicare Other

## 2016-11-27 ENCOUNTER — Ambulatory Visit (INDEPENDENT_AMBULATORY_CARE_PROVIDER_SITE_OTHER): Payer: Medicare Other | Admitting: Podiatry

## 2016-11-27 DIAGNOSIS — M2012 Hallux valgus (acquired), left foot: Secondary | ICD-10-CM | POA: Diagnosis not present

## 2016-11-27 DIAGNOSIS — M2042 Other hammer toe(s) (acquired), left foot: Secondary | ICD-10-CM

## 2016-11-27 NOTE — Progress Notes (Signed)
She presents today for follow-up her Akin osteotomy injection of fibroma hammertoe repair second and release the second metatarsophalangeal joint and hammertoe repair with pin. She states that the rash seems to be subsiding since the pin was pulled.  Objective: Vital signs are stable she is alert and oriented 3 pulses are palpable. Second digit is mildly erythematous this is secondary to sloughing of the skin from the cyanosis that occurred after surgery. There are no open wounds or lesions. The toe appears to be rectus and in good position. Radiographs taken today demonstrate well-healing osteotomy proximal phalanx. Toe is rectus and healing at the level of the PIPJ.  Assessment: Well-healing surgical foot.  Plan: Suggest that she can get back to regular shoe gear in the next few weeks I will follow-up with her soon.

## 2016-12-30 ENCOUNTER — Ambulatory Visit (INDEPENDENT_AMBULATORY_CARE_PROVIDER_SITE_OTHER): Payer: Medicare Other

## 2016-12-30 ENCOUNTER — Ambulatory Visit (INDEPENDENT_AMBULATORY_CARE_PROVIDER_SITE_OTHER): Payer: Medicare Other | Admitting: Podiatry

## 2016-12-30 ENCOUNTER — Encounter: Payer: Self-pay | Admitting: Podiatry

## 2016-12-30 DIAGNOSIS — M2042 Other hammer toe(s) (acquired), left foot: Secondary | ICD-10-CM

## 2016-12-30 DIAGNOSIS — M2012 Hallux valgus (acquired), left foot: Secondary | ICD-10-CM | POA: Diagnosis not present

## 2016-12-30 DIAGNOSIS — M722 Plantar fascial fibromatosis: Secondary | ICD-10-CM | POA: Diagnosis not present

## 2016-12-30 DIAGNOSIS — L600 Ingrowing nail: Secondary | ICD-10-CM

## 2016-12-30 MED ORDER — NEOMYCIN-POLYMYXIN-HC 1 % OT SOLN: OTIC | 1 refills | Status: DC

## 2016-12-30 NOTE — Progress Notes (Signed)
She presents today for follow-up of surgery to the left foot. She states that seems to be doing much better that she does get some I sensations to the toe. Her right hallux has an ingrown nail which is painful palpation as well as debridement.  Objective: Pulses are strongly palpable. Neurologic sensorium is intact. Degenerative flexors are intact. Surgical foot appears to be healing very nicely left. She has sharp incurvated nail margin along the tibial border of the hallux right.  Assessment: A limp secondary to ingrown nail well-healed surgical foot left.  Plan: Chemical matricectomy was performed today after local anesthesia was administered she tolerated procedure well. She is provided with both oral and written postoperative care and soaking of her foot as well as a prescription for noted to be applied. Follow-up with me in 2-3 weeks.

## 2016-12-30 NOTE — Patient Instructions (Signed)

## 2017-01-09 NOTE — Progress Notes (Signed)
DOS [id number] Aikin Osteotomy hallux left with screw release 2nd MTPJ left foot ; Hammer toe repair 2nd left with pin/ screw. Cortisone injection rt plantar fibroma

## 2017-01-20 ENCOUNTER — Ambulatory Visit (INDEPENDENT_AMBULATORY_CARE_PROVIDER_SITE_OTHER): Payer: Medicare Other | Admitting: Podiatry

## 2017-01-20 ENCOUNTER — Encounter: Payer: Self-pay | Admitting: Podiatry

## 2017-01-20 ENCOUNTER — Other Ambulatory Visit: Payer: Self-pay | Admitting: Internal Medicine

## 2017-01-20 DIAGNOSIS — L600 Ingrowing nail: Secondary | ICD-10-CM

## 2017-01-20 DIAGNOSIS — Z1231 Encounter for screening mammogram for malignant neoplasm of breast: Secondary | ICD-10-CM

## 2017-01-20 DIAGNOSIS — M7752 Other enthesopathy of left foot: Secondary | ICD-10-CM | POA: Diagnosis not present

## 2017-01-20 DIAGNOSIS — M779 Enthesopathy, unspecified: Secondary | ICD-10-CM

## 2017-01-20 DIAGNOSIS — M778 Other enthesopathies, not elsewhere classified: Secondary | ICD-10-CM

## 2017-01-20 NOTE — Progress Notes (Signed)
She presents today for a chief complaint of a painful first interdigital space of the left foot. She states has been bothering her for about 3 weeks now. This is the surgical foot for an [facility] bunion repair second metatarsal osteotomy and hammertoe repair was performed. She states that it feels like there is a stabbing sensation down deep between the toes. She is also here for follow-up matrixectomy of her right hallux. She states this been doing just fine without problems.  Objective: Her signs are stable alert and oriented 3. Pulses are palpable. She has reproducible pain on palpation between the deep peroneal nerve between the versa and second toes of the left foot. She also has tenderness on palpation of the tibial border of the hallux right but there is no erythema cellulitis drainage or odor.  Assessment: Well-healing surgical foot left. Neuritis with capsulitis left foot. Well-healing surgical toe hallux right.  Plan: I injected her third dose of Kenalog and local anesthetic to the first interdigital space left foot. May need to consider dehydrated out all injections in the future. Right foot appears to be healing uneventfully continue to soak in Epsom salts and warm water at least every other day covered in the day with a Band-Aid follow-up with me as needed.

## 2017-01-20 NOTE — Patient Instructions (Signed)

## 2017-02-12 ENCOUNTER — Telehealth: Payer: Self-pay | Admitting: Podiatry

## 2017-02-12 NOTE — Telephone Encounter (Signed)
Pt called and fell on her left great toe where surgery was done a in march. She thinks it is just bruised she is able to walk on it and bend it. She is having pain but that has not changed since injury. She said before injuring she was not able to bend toe. She wanted to make sure Dr Milinda Pointer was aware and will call if she feels she needs to be seen before her appt on 7.16.18 with Dr Milinda Pointer.

## 2017-02-12 NOTE — Telephone Encounter (Signed)
Left message encouraging pt to go back in the surgical shoe for comfort if necessary, ice 3-4 times daily for 15-20 minutes for pain, swelling and inflammation and to call with concerns or an earlier appt if needed.

## 2017-02-13 ENCOUNTER — Ambulatory Visit
Admission: RE | Admit: 2017-02-13 | Discharge: 2017-02-13 | Disposition: A | Discharge status: Home / Self Care | Payer: Medicare Other | Source: Ambulatory Visit | Attending: Internal Medicine | Admitting: Internal Medicine

## 2017-02-13 DIAGNOSIS — Z1231 Encounter for screening mammogram for malignant neoplasm of breast: Secondary | ICD-10-CM | POA: Insufficient documentation

## 2017-03-03 ENCOUNTER — Ambulatory Visit (INDEPENDENT_AMBULATORY_CARE_PROVIDER_SITE_OTHER): Payer: Medicare Other

## 2017-03-03 ENCOUNTER — Encounter: Payer: Self-pay | Admitting: Podiatry

## 2017-03-03 ENCOUNTER — Ambulatory Visit (INDEPENDENT_AMBULATORY_CARE_PROVIDER_SITE_OTHER): Payer: Medicare Other | Admitting: Podiatry

## 2017-03-03 DIAGNOSIS — S9032XA Contusion of left foot, initial encounter: Secondary | ICD-10-CM

## 2017-03-03 DIAGNOSIS — G5792 Unspecified mononeuropathy of left lower limb: Secondary | ICD-10-CM

## 2017-03-03 NOTE — Progress Notes (Signed)
She presents today states that she was plain with her grandkids and she hit her hallux of her left foot. She states it hasn't stopped hurting yet has been nearly 2 weeks since the incident. She still has pain between the first and second toes though the injection did help last visit.  Objective: Vital signs are stable she is alert and oriented 3. Radiographs taken today do not demonstrate any type fracture to the possible phalanx of the left hallux. There appears to be internal fixation still present in the Akin osteotomy which makes me concerned that there may be some neuritis associated with that screw.  Assessment: Neuritis and contusion hallux left.  Plan: I injected with dehydrated alcohol today follow-up with her in 3-4 weeks consider removing the screw at the end of August.

## 2017-04-16 ENCOUNTER — Encounter: Payer: Self-pay | Admitting: Podiatry

## 2017-04-16 ENCOUNTER — Telehealth: Payer: Self-pay | Admitting: *Deleted

## 2017-04-16 ENCOUNTER — Ambulatory Visit (INDEPENDENT_AMBULATORY_CARE_PROVIDER_SITE_OTHER): Payer: Medicare Other | Admitting: Podiatry

## 2017-04-16 DIAGNOSIS — G5792 Unspecified mononeuropathy of left lower limb: Secondary | ICD-10-CM | POA: Diagnosis not present

## 2017-04-16 NOTE — Progress Notes (Signed)
She presents today for follow-up of her neuritis states it is so sore she refers to the hallux lateral aspect left foot.  Objective: Vital signs are stable alert and oriented 3. Pulses are palpable. At this point just pain on palpation proximal lateral aspect of the hallux plantarly.  Assessment: Painful internal fixation with neuritis.  Plan: Reinjected the area today. She signed consent form today for removal of internal fixation she states she elected to have this removed. She feels that this was causing her pain. Discussed appropriate postoperative complications follow up with me in the near future surgery.

## 2017-04-16 NOTE — Patient Instructions (Signed)
Pre-Operative Instructions  Congratulations, you have decided to take an important step towards improving your quality of life.  You can be assured that the doctors and staff at [facility] will be with you every step of the way.  Here are some important things you should know:  1. Plan to be at the surgery center/hospital at least 1 (one) hour prior to your scheduled time, unless otherwise directed by the surgical center/hospital staff.  You must have a responsible adult accompany you, remain during the surgery and drive you home.  Make sure you have directions to the surgical center/hospital to ensure you arrive on time. 2. If you are having surgery at [facility] or [facility] hospitals, you will need a copy of your medical history and physical form from your family physician within one month prior to the date of surgery. We will give you a form for your primary physician to complete.  3. We make every effort to accommodate the date you request for surgery.  However, there are times where surgery dates or times have to be moved.  We will contact you as soon as possible if a change in schedule is required.   4. No aspirin/ibuprofen for one week before surgery.  If you are on aspirin, any non-steroidal anti-inflammatory medications (Mobic, Aleve, Ibuprofen) should not be taken seven (7) days prior to your surgery.  You make take Tylenol for pain prior to surgery.  5. Medications - If you are taking daily heart and blood pressure medications, seizure, reflux, allergy, asthma, anxiety, pain or diabetes medications, make sure you notify the surgery center/hospital before the day of surgery so they can tell you which medications you should take or avoid the day of surgery. 6. No food or drink after midnight the night before surgery unless directed otherwise by surgical center/hospital staff. 7. No alcoholic beverages 24-hours prior to surgery.  No smoking 24-hours prior or 24-hours after  surgery. 8. Wear loose pants or shorts. They should be loose enough to fit over bandages, boots, and casts. 9. Don't wear slip-on shoes. Sneakers are preferred. 10. Bring your boot with you to the surgery center/hospital.  Also bring crutches or a walker if your physician has prescribed it for you.  If you do not have this equipment, it will be provided for you after surgery. 11. If you have not been contacted by the surgery center/hospital by the day before your surgery, call to confirm the date and time of your surgery. 12. Leave-time from work may vary depending on the type of surgery you have.  Appropriate arrangements should be made prior to surgery with your employer. 13. Prescriptions will be provided immediately following surgery by your doctor.  Fill these as soon as possible after surgery and take the medication as directed. Pain medications will not be refilled on weekends and must be approved by the doctor. 14. Remove nail polish on the operative foot and avoid getting pedicures prior to surgery. 15. Wash the night before surgery.  The night before surgery wash the foot and leg well with water and the antibacterial soap provided. Be sure to pay special attention to beneath the toenails and in between the toes.  Wash for at least three (3) minutes. Rinse thoroughly with water and dry well with a towel.  Perform this wash unless told not to do so by your physician.  Enclosed: 1 Ice pack (please put in freezer the night before surgery)   1 Hibiclens skin cleaner     Pre-op instructions  If you have any questions regarding the instructions, please do not hesitate to call our office.  [place]: [address], [place], [place] [address] -- [phone]  [place]: [address] [place], [place] [address] -- [phone]  [place]: [address]  , [place] [address] -- [phone]  [place]: [address], [address], [place], [place] [address] -- [phone]  Website:  [email or web address] 

## 2017-04-16 NOTE — Telephone Encounter (Signed)
"  I need to set up an appointment for surgery through Dr. Milinda Pointer.  I'd like for it to be early morning if possible, ASAP.  Thank you."

## 2017-04-22 NOTE — Telephone Encounter (Signed)
"  I need to make an appointment per Dr. Milinda Pointer to have foot surgery."

## 2017-04-23 NOTE — Telephone Encounter (Signed)
"  Once again I'm calling about scheduling an appointment."  I attempted to return her call.  I left her a message that Dr. Stephenie Acres next available allotment is September 21 or any Friday in October.  I asked her to call me back.

## 2017-04-25 NOTE — Telephone Encounter (Signed)
"  Sorry I missed your call.  Give me a call.  Yes, I'd like to go ahead and get it done.  I think you said September 21 or 27.  I ain't sure.  Please give me a call."

## 2017-04-25 NOTE — Telephone Encounter (Signed)
I'm returning your call.  "We been playing phone tag.  I want to schedule my surgery for September 21.  I want it then because I'm suppose to go to the [place] with my grandson on October 10.  I want my foot to be better by then."  I will get you scheduled for September 21.  "What time will I need to be there?"  I cannot give you a time.  "I'd like to have it done as early as possible."

## 2017-05-08 ENCOUNTER — Other Ambulatory Visit: Payer: Self-pay | Admitting: Podiatry

## 2017-05-08 MED ORDER — PROMETHAZINE HCL 25 MG PO TABS: 25.0000 mg | ORAL_TABLET | Freq: Three times a day (TID) | ORAL | 0 refills | Status: DC | PRN

## 2017-05-08 MED ORDER — MEPERIDINE HCL 50 MG PO TABS: ORAL_TABLET | ORAL | 0 refills | Status: DC

## 2017-05-08 MED ORDER — CLINDAMYCIN HCL 150 MG PO CAPS: 150.0000 mg | ORAL_CAPSULE | Freq: Three times a day (TID) | ORAL | 0 refills | Status: DC

## 2017-05-09 ENCOUNTER — Encounter: Payer: Self-pay | Admitting: Podiatry

## 2017-05-09 DIAGNOSIS — Z4889 Encounter for other specified surgical aftercare: Secondary | ICD-10-CM | POA: Diagnosis not present

## 2017-05-16 ENCOUNTER — Ambulatory Visit (INDEPENDENT_AMBULATORY_CARE_PROVIDER_SITE_OTHER): Payer: Medicare Other | Admitting: Podiatry

## 2017-05-16 ENCOUNTER — Encounter: Payer: Medicare Other | Admitting: Podiatry

## 2017-05-16 ENCOUNTER — Ambulatory Visit (INDEPENDENT_AMBULATORY_CARE_PROVIDER_SITE_OTHER): Payer: Medicare Other

## 2017-05-16 DIAGNOSIS — Z9889 Other specified postprocedural states: Secondary | ICD-10-CM | POA: Diagnosis not present

## 2017-05-16 NOTE — Progress Notes (Signed)
   Subjective:  Patient presents today status post removal painful hardware to the left great toe. DOS: 05/09/2017. Patient states that she's doing well.   Past Medical History:  Diagnosis Date  . Arthritis    hands, feet  . Chronic fatigue   . Fibromyalgia    s/p lyme disease  . GERD (gastroesophageal reflux disease)   . IBS (irritable bowel syndrome)   . Lyme disease   . Shortness of breath dyspnea    from inactivity  . Wears dentures    full upper, partial lower (doesn't wear lower)      Objective/Physical Exam Skin incisions appear to be well coapted with sutures and staples intact. No sign of infectious process noted. No dehiscence. No active bleeding noted.Minimal edema noted to the surgical extremity.  Radiographic Exam:  Orthopedic hardware and osteotomies sites appear to be stable with routine healing.  Assessment: 1. s/p Removal painful hardware left great toe. DOS: 05/09/2017   Plan of Care:  1. Patient was evaluated. X-rays reviewed 2. Patient can begin showering. Recommend antibiotic ointment and Band-Aid daily. 3. Return to clinic in 1 week for suture removal with Dr. Milinda Pointer   Edrick Kins, DPM [facility]  Dr. Edrick Kins, [address]                                        [place], [place] [address]                Office [phone]  Fax [phone]

## 2017-05-21 ENCOUNTER — Encounter: Payer: Self-pay | Admitting: Podiatry

## 2017-05-21 ENCOUNTER — Ambulatory Visit (INDEPENDENT_AMBULATORY_CARE_PROVIDER_SITE_OTHER): Payer: Self-pay | Admitting: Podiatry

## 2017-05-21 DIAGNOSIS — Z9889 Other specified postprocedural states: Secondary | ICD-10-CM

## 2017-05-21 DIAGNOSIS — T847XXD Infection and inflammatory reaction due to other internal orthopedic prosthetic devices, implants and grafts, subsequent encounter: Secondary | ICD-10-CM

## 2017-05-21 NOTE — Progress Notes (Signed)
She presents today 2 weeks status post removal internal fixation of her hallux left. She states the toe feels great much better than it did prior to surgery she knew it was a screw that was causing pain.  Objective: Vital signs are stable she is alert and oriented 3 inches or intact margins were coapted SE no signs of infection. We've removed the sutures today margins remain well coapted.  Assessment: Well-healing surgical toe hallux left.  Plan: Follow up with me on an as-needed basis.

## 2017-05-27 NOTE — Progress Notes (Signed)
DOS 05/09/17 Excision/removal screw 1st toe Lt foot

## 2017-06-04 ENCOUNTER — Ambulatory Visit (INDEPENDENT_AMBULATORY_CARE_PROVIDER_SITE_OTHER): Payer: Medicare Other

## 2017-06-04 ENCOUNTER — Other Ambulatory Visit: Payer: Self-pay | Admitting: Podiatry

## 2017-06-04 ENCOUNTER — Ambulatory Visit (INDEPENDENT_AMBULATORY_CARE_PROVIDER_SITE_OTHER): Payer: Medicare Other | Admitting: Podiatry

## 2017-06-04 DIAGNOSIS — M7752 Other enthesopathy of left foot: Secondary | ICD-10-CM

## 2017-06-04 DIAGNOSIS — M2042 Other hammer toe(s) (acquired), left foot: Secondary | ICD-10-CM

## 2017-06-04 DIAGNOSIS — M778 Other enthesopathies, not elsewhere classified: Secondary | ICD-10-CM

## 2017-06-04 DIAGNOSIS — M779 Enthesopathy, unspecified: Principal | ICD-10-CM

## 2017-06-04 NOTE — Progress Notes (Signed)
She presents today date of surgery 05/09/2017 removal painful internal fixation left foot. She states that the left foot hurts along the proximal lateral aspect. She states this is regarding now worse than it did before the surgery. She states the surgical site to the hallux left has gone on to heal uneventfully and has absolute no pain associated with it any longer.  Objective: Vital signs are stable she's a oriented 3. Pulses are palpable. Neurologic sensorium is intact. Surgical site is gone on to heal uneventfully radiographs taken today demonstrate a well-healed osteotomy. She does not have any sign of any osseous abnormalities on the lateral aspect of the left foot though she does have some tenderness on palpation of the fourth and fifth metatarsocuboid articulation as well as pain on palpation of the sinus tarsi left.  Assessment: Sinus tarsitis left foot. Capsulitis fourth and fifth metatarsocuboid articulation left foot.  Plan: Discussed etiology pathology conservative versus surgical therapies. I injected the left sinus tarsi today after sterile Betadine skin prep. Follow up with her in a few weeks.

## 2017-06-27 ENCOUNTER — Ambulatory Visit (INDEPENDENT_AMBULATORY_CARE_PROVIDER_SITE_OTHER): Payer: Medicare Other | Admitting: Family Medicine

## 2017-06-27 ENCOUNTER — Encounter: Payer: Self-pay | Admitting: Family Medicine

## 2017-06-27 VITALS — BP 125/81 | HR 96 | Temp 98.3°F | Ht 67.0 in | Wt 149.0 lb

## 2017-06-27 DIAGNOSIS — Z7689 Persons encountering health services in other specified circumstances: Secondary | ICD-10-CM

## 2017-06-27 NOTE — Progress Notes (Signed)
Pt presented today to establish care and was very straightforward about her needs from her new PCP - has 3 controlled substances on her regular medication list and needs to find someone who will be able to keep these filled without sending her to pain management. I informed her of our policy and capabilities and she politely stated that we were not a good fit.

## 2017-08-15 ENCOUNTER — Observation Stay
Admission: EM | Admit: 2017-08-15 | Discharge: 2017-08-16 | Disposition: A | Discharge status: Home / Self Care | Payer: Medicare Other | Attending: Surgery | Admitting: Surgery

## 2017-08-15 ENCOUNTER — Emergency Department: Payer: Medicare Other

## 2017-08-15 ENCOUNTER — Encounter
Admission: EM | Disposition: A | Discharge status: Home / Self Care | Payer: Self-pay | Source: Home / Self Care | Attending: Emergency Medicine

## 2017-08-15 ENCOUNTER — Observation Stay: Payer: Medicare Other | Admitting: Anesthesiology

## 2017-08-15 DIAGNOSIS — K3580 Unspecified acute appendicitis: Secondary | ICD-10-CM | POA: Diagnosis present

## 2017-08-15 DIAGNOSIS — Z881 Allergy status to other antibiotic agents status: Secondary | ICD-10-CM | POA: Insufficient documentation

## 2017-08-15 DIAGNOSIS — M797 Fibromyalgia: Secondary | ICD-10-CM | POA: Insufficient documentation

## 2017-08-15 DIAGNOSIS — K589 Irritable bowel syndrome without diarrhea: Secondary | ICD-10-CM | POA: Diagnosis not present

## 2017-08-15 DIAGNOSIS — K3533 Acute appendicitis with perforation and localized peritonitis, with abscess: Principal | ICD-10-CM | POA: Insufficient documentation

## 2017-08-15 DIAGNOSIS — Z882 Allergy status to sulfonamides status: Secondary | ICD-10-CM | POA: Insufficient documentation

## 2017-08-15 DIAGNOSIS — K219 Gastro-esophageal reflux disease without esophagitis: Secondary | ICD-10-CM | POA: Insufficient documentation

## 2017-08-15 DIAGNOSIS — Z885 Allergy status to narcotic agent status: Secondary | ICD-10-CM | POA: Insufficient documentation

## 2017-08-15 DIAGNOSIS — J449 Chronic obstructive pulmonary disease, unspecified: Secondary | ICD-10-CM | POA: Diagnosis not present

## 2017-08-15 DIAGNOSIS — Z880 Allergy status to penicillin: Secondary | ICD-10-CM | POA: Diagnosis not present

## 2017-08-15 DIAGNOSIS — Z886 Allergy status to analgesic agent status: Secondary | ICD-10-CM | POA: Insufficient documentation

## 2017-08-15 DIAGNOSIS — F17210 Nicotine dependence, cigarettes, uncomplicated: Secondary | ICD-10-CM | POA: Diagnosis not present

## 2017-08-15 DIAGNOSIS — R5382 Chronic fatigue, unspecified: Secondary | ICD-10-CM | POA: Insufficient documentation

## 2017-08-15 DIAGNOSIS — Z79899 Other long term (current) drug therapy: Secondary | ICD-10-CM | POA: Insufficient documentation

## 2017-08-15 DIAGNOSIS — Z888 Allergy status to other drugs, medicaments and biological substances status: Secondary | ICD-10-CM | POA: Diagnosis not present

## 2017-08-15 HISTORY — PX: LAPAROSCOPIC APPENDECTOMY: SHX408

## 2017-08-15 LAB — URINALYSIS, COMPLETE (UACMP) WITH MICROSCOPIC
Bacteria, UA: NONE SEEN
Bilirubin Urine: NEGATIVE
Glucose, UA: NEGATIVE mg/dL
Hgb urine dipstick: NEGATIVE
KETONES UR: NEGATIVE mg/dL
Leukocytes, UA: NEGATIVE
Nitrite: NEGATIVE
PH: 6 (ref 5.0–8.0)
Protein, ur: NEGATIVE mg/dL
RBC / HPF: NONE SEEN RBC/hpf (ref 0–5)
SPECIFIC GRAVITY, URINE: 1.015 (ref 1.005–1.030)
SQUAMOUS EPITHELIAL / LPF: NONE SEEN

## 2017-08-15 LAB — CBC
HCT: 36.7 % (ref 35.0–47.0)
Hemoglobin: 12.1 g/dL (ref 12.0–16.0)
MCH: 27.7 pg (ref 26.0–34.0)
MCHC: 32.8 g/dL (ref 32.0–36.0)
MCV: 84.4 fL (ref 80.0–100.0)
PLATELETS: 271 10*3/uL (ref 150–440)
RBC: 4.35 MIL/uL (ref 3.80–5.20)
RDW: 14.8 % — AB (ref 11.5–14.5)
WBC: 15.4 10*3/uL — ABNORMAL HIGH (ref 3.6–11.0)

## 2017-08-15 LAB — COMPREHENSIVE METABOLIC PANEL
ALT: 17 U/L (ref 14–54)
AST: 39 U/L (ref 15–41)
Albumin: 3.7 g/dL (ref 3.5–5.0)
Alkaline Phosphatase: 97 U/L (ref 38–126)
Anion gap: 11 (ref 5–15)
BUN: 11 mg/dL (ref 6–20)
CHLORIDE: 104 mmol/L (ref 101–111)
CO2: 19 mmol/L — AB (ref 22–32)
CREATININE: 0.84 mg/dL (ref 0.44–1.00)
Calcium: 8.7 mg/dL — ABNORMAL LOW (ref 8.9–10.3)
GFR calc Af Amer: >60 mL/min (ref >60)
GFR calc non Af Amer: >60 mL/min (ref >60)
GLUCOSE: 109 mg/dL — AB (ref 65–99)
Potassium: 3.5 mmol/L (ref 3.5–5.1)
SODIUM: 134 mmol/L — AB (ref 135–145)
Total Bilirubin: 0.3 mg/dL (ref 0.3–1.2)
Total Protein: 7.1 g/dL (ref 6.5–8.1)

## 2017-08-15 LAB — LIPASE, BLOOD: LIPASE: 22 U/L (ref 11–51)

## 2017-08-15 SURGERY — APPENDECTOMY, LAPAROSCOPIC
Anesthesia: General

## 2017-08-15 MED ORDER — ENOXAPARIN SODIUM 40 MG/0.4ML ~~LOC~~ SOLN: 40.0000 mg | SUBCUTANEOUS | Status: DC

## 2017-08-15 MED ORDER — DEXAMETHASONE SODIUM PHOSPHATE 10 MG/ML IJ SOLN
INTRAMUSCULAR | Status: AC
  Filled 2017-08-15: qty 1

## 2017-08-15 MED ORDER — CIPROFLOXACIN IN D5W 400 MG/200ML IV SOLN
400.0000 mg | Freq: Once | INTRAVENOUS | Status: AC
  Administered 2017-08-15: 400 mg via INTRAVENOUS
  Filled 2017-08-15: qty 200

## 2017-08-15 MED ORDER — SUGAMMADEX SODIUM 200 MG/2ML IV SOLN
INTRAVENOUS | Status: AC
  Filled 2017-08-15: qty 2

## 2017-08-15 MED ORDER — IOPAMIDOL (ISOVUE-300) INJECTION 61%
100.0000 mL | Freq: Once | INTRAVENOUS | Status: AC | PRN
  Administered 2017-08-15: 100 mL via INTRAVENOUS

## 2017-08-15 MED ORDER — METRONIDAZOLE IN NACL 5-0.79 MG/ML-% IV SOLN
500.0000 mg | Freq: Once | INTRAVENOUS | Status: AC
  Administered 2017-08-15: 500 mg via INTRAVENOUS
  Filled 2017-08-15: qty 100

## 2017-08-15 MED ORDER — ONDANSETRON HCL 4 MG/2ML IJ SOLN
INTRAMUSCULAR | Status: AC
  Filled 2017-08-15: qty 2

## 2017-08-15 MED ORDER — PHENYLEPHRINE HCL 10 MG/ML IJ SOLN
INTRAMUSCULAR | Status: DC | PRN
  Administered 2017-08-15: 200 ug via INTRAVENOUS

## 2017-08-15 MED ORDER — ONDANSETRON HCL 4 MG/2ML IJ SOLN
4.0000 mg | Freq: Once | INTRAMUSCULAR | Status: AC | PRN
  Administered 2017-08-15: 4 mg via INTRAVENOUS
  Filled 2017-08-15: qty 2

## 2017-08-15 MED ORDER — LACTATED RINGERS IV SOLN
125.0000 mL/h | INTRAVENOUS | Status: DC
  Administered 2017-08-15 (×2): via INTRAVENOUS

## 2017-08-15 MED ORDER — MIDAZOLAM HCL 2 MG/2ML IJ SOLN
INTRAMUSCULAR | Status: AC
  Filled 2017-08-15: qty 2

## 2017-08-15 MED ORDER — HYDROMORPHONE HCL 1 MG/ML IJ SOLN
0.5000 mg | INTRAMUSCULAR | Status: DC | PRN
  Administered 2017-08-15 – 2017-08-16 (×2): 0.5 mg via INTRAVENOUS
  Filled 2017-08-15: qty 0.5

## 2017-08-15 MED ORDER — BUPIVACAINE-EPINEPHRINE (PF) 0.5% -1:200000 IJ SOLN
INTRAMUSCULAR | Status: AC
  Filled 2017-08-15: qty 30

## 2017-08-15 MED ORDER — MORPHINE SULFATE (PF) 4 MG/ML IV SOLN
4.0000 mg | Freq: Once | INTRAVENOUS | Status: AC
  Administered 2017-08-15: 4 mg via INTRAVENOUS
  Filled 2017-08-15: qty 1

## 2017-08-15 MED ORDER — PANTOPRAZOLE SODIUM 40 MG IV SOLR: 40.0000 mg | Freq: Every day | INTRAVENOUS | Status: DC

## 2017-08-15 MED ORDER — LIDOCAINE HCL (PF) 2 % IJ SOLN
INTRAMUSCULAR | Status: AC
  Filled 2017-08-15: qty 10

## 2017-08-15 MED ORDER — MIDAZOLAM HCL 2 MG/2ML IJ SOLN
INTRAMUSCULAR | Status: DC | PRN
  Administered 2017-08-15: 2 mg via INTRAVENOUS

## 2017-08-15 MED ORDER — ONDANSETRON 4 MG PO TBDP: 4.0000 mg | ORAL_TABLET | Freq: Four times a day (QID) | ORAL | Status: DC | PRN

## 2017-08-15 MED ORDER — PROPOFOL 10 MG/ML IV BOLUS
INTRAVENOUS | Status: DC | PRN
  Administered 2017-08-15: 150 mg via INTRAVENOUS

## 2017-08-15 MED ORDER — FENTANYL CITRATE (PF) 100 MCG/2ML IJ SOLN
INTRAMUSCULAR | Status: AC
  Filled 2017-08-15: qty 2

## 2017-08-15 MED ORDER — ROCURONIUM BROMIDE 100 MG/10ML IV SOLN
INTRAVENOUS | Status: DC | PRN
Start: 2017-08-15 — End: 2017-08-16
  Administered 2017-08-15: 40 mg via INTRAVENOUS
  Administered 2017-08-15 – 2017-08-16 (×2): 10 mg via INTRAVENOUS

## 2017-08-15 MED ORDER — POLYETHYLENE GLYCOL 3350 17 G PO PACK: 17.0000 g | PACK | Freq: Every day | ORAL | Status: DC | PRN

## 2017-08-15 MED ORDER — PROPOFOL 10 MG/ML IV BOLUS
INTRAVENOUS | Status: AC
  Filled 2017-08-15: qty 20

## 2017-08-15 MED ORDER — DEXAMETHASONE SODIUM PHOSPHATE 10 MG/ML IJ SOLN
INTRAMUSCULAR | Status: DC | PRN
  Administered 2017-08-15: 10 mg via INTRAVENOUS

## 2017-08-15 MED ORDER — ONDANSETRON HCL 4 MG/2ML IJ SOLN
4.0000 mg | Freq: Four times a day (QID) | INTRAMUSCULAR | Status: DC | PRN
  Administered 2017-08-15 (×2): 4 mg via INTRAVENOUS

## 2017-08-15 MED ORDER — METRONIDAZOLE IN NACL 5-0.79 MG/ML-% IV SOLN
500.0000 mg | Freq: Three times a day (TID) | INTRAVENOUS | Status: DC
  Administered 2017-08-16: 500 mg via INTRAVENOUS
  Filled 2017-08-15 (×3): qty 100

## 2017-08-15 MED ORDER — FENTANYL CITRATE (PF) 100 MCG/2ML IJ SOLN
INTRAMUSCULAR | Status: DC | PRN
  Administered 2017-08-15: 100 ug via INTRAVENOUS

## 2017-08-15 MED ORDER — CIPROFLOXACIN IN D5W 400 MG/200ML IV SOLN
400.0000 mg | Freq: Two times a day (BID) | INTRAVENOUS | Status: DC
  Administered 2017-08-16: 400 mg via INTRAVENOUS
  Filled 2017-08-15 (×2): qty 200

## 2017-08-15 MED ORDER — LIDOCAINE HCL (CARDIAC) 20 MG/ML IV SOLN
INTRAVENOUS | Status: DC | PRN
  Administered 2017-08-15: 100 mg via INTRAVENOUS

## 2017-08-15 MED ORDER — ACETAMINOPHEN 10 MG/ML IV SOLN
INTRAVENOUS | Status: AC
  Filled 2017-08-15: qty 100

## 2017-08-15 MED ORDER — SUCCINYLCHOLINE CHLORIDE 20 MG/ML IJ SOLN
INTRAMUSCULAR | Status: DC | PRN
  Administered 2017-08-15: 100 mg via INTRAVENOUS
  Administered 2017-08-15 – 2017-08-16 (×2): 50 mg via INTRAVENOUS

## 2017-08-15 MED ORDER — ROCURONIUM BROMIDE 50 MG/5ML IV SOLN
INTRAVENOUS | Status: AC
  Filled 2017-08-15: qty 1

## 2017-08-15 MED ORDER — HYDROMORPHONE HCL 1 MG/ML IJ SOLN
INTRAMUSCULAR | Status: AC
  Administered 2017-08-16: 0.5 mg via INTRAVENOUS
  Filled 2017-08-15: qty 1

## 2017-08-15 MED ORDER — SUCCINYLCHOLINE CHLORIDE 20 MG/ML IJ SOLN
INTRAMUSCULAR | Status: AC
  Filled 2017-08-15: qty 1

## 2017-08-15 SURGICAL SUPPLY — 37 items
CANISTER SUCT 1200ML W/VALVE (MISCELLANEOUS) ×3 IMPLANT
CHLORAPREP W/TINT 26ML (MISCELLANEOUS) ×3 IMPLANT
CUTTER FLEX LINEAR 45M (STAPLE) ×3 IMPLANT
DERMABOND ADVANCED (GAUZE/BANDAGES/DRESSINGS) ×2
DERMABOND ADVANCED .7 DNX12 (GAUZE/BANDAGES/DRESSINGS) ×1 IMPLANT
ELECT CAUTERY BLADE 6.4 (BLADE) ×3 IMPLANT
ELECT REM PT RETURN 9FT ADLT (ELECTROSURGICAL) ×3
ELECTRODE REM PT RTRN 9FT ADLT (ELECTROSURGICAL) ×1 IMPLANT
GLOVE SURG SYN 7.0 (GLOVE) ×6 IMPLANT
GLOVE SURG SYN 7.5  E (GLOVE) ×4
GLOVE SURG SYN 7.5 E (GLOVE) ×2 IMPLANT
GOWN STRL REUS W/ TWL LRG LVL3 (GOWN DISPOSABLE) ×2 IMPLANT
GOWN STRL REUS W/TWL LRG LVL3 (GOWN DISPOSABLE) ×4
IRRIGATION STRYKERFLOW (MISCELLANEOUS) ×1 IMPLANT
IRRIGATOR STRYKERFLOW (MISCELLANEOUS) ×3
IV NS 1000ML (IV SOLUTION) ×2
IV NS 1000ML BAXH (IV SOLUTION) ×1 IMPLANT
KIT RM TURNOVER STRD PROC AR (KITS) ×3 IMPLANT
LABEL OR SOLS (LABEL) IMPLANT
LIGASURE LAP MARYLAND 5MM 37CM (ELECTROSURGICAL) ×3 IMPLANT
NEEDLE HYPO 22GX1.5 SAFETY (NEEDLE) ×3 IMPLANT
NS IRRIG 500ML POUR BTL (IV SOLUTION) ×3 IMPLANT
PACK LAP CHOLECYSTECTOMY (MISCELLANEOUS) ×3 IMPLANT
PENCIL ELECTRO HAND CTR (MISCELLANEOUS) ×3 IMPLANT
POUCH SPECIMEN RETRIEVAL 10MM (ENDOMECHANICALS) ×3 IMPLANT
RELOAD 45 VASCULAR/THIN (ENDOMECHANICALS) IMPLANT
RELOAD STAPLE TA45 3.5 REG BLU (ENDOMECHANICALS) ×3 IMPLANT
SCISSORS METZENBAUM CVD 33 (INSTRUMENTS) IMPLANT
SLEEVE ADV FIXATION 5X100MM (TROCAR) ×3 IMPLANT
SUT MNCRL 4-0 (SUTURE) ×2
SUT MNCRL 4-0 27XMFL (SUTURE) ×1
SUT VICRYL 0 AB UR-6 (SUTURE) ×3 IMPLANT
SUTURE MNCRL 4-0 27XMF (SUTURE) ×1 IMPLANT
TRAY FOLEY W/METER SILVER 16FR (SET/KITS/TRAYS/PACK) ×3 IMPLANT
TROCAR BALLN GELPORT 12X130M (ENDOMECHANICALS) ×3 IMPLANT
TROCAR Z-THREAD OPTICAL 5X100M (TROCAR) ×3 IMPLANT
TUBING INSUFFLATION (TUBING) ×3 IMPLANT

## 2017-08-15 NOTE — ED Provider Notes (Signed)
[facility] Emergency Department Provider Note ____________________________________________   I have reviewed the triage vital signs and the triage nursing note.  HISTORY  Chief Complaint Abdominal Pain   Historian Patient  HPI Judy Salinas is a 61 y.o. female presenting with right-sided right lower quadrant pain for about 4 days now, worsening, currently 9 out of 10.  Low-grade temperatures and diaphoresis.  Nausea without vomiting or diarrhea.  No black or bloody stools.  Nothing makes it worse or better.  She has had a hysterectomy but she stills her ovaries.  She still has her appendix.   Past Medical History:  Diagnosis Date  . Arthritis    hands, feet  . Chronic fatigue   . Fibromyalgia    s/p lyme disease  . GERD (gastroesophageal reflux disease)   . IBS (irritable bowel syndrome)   . Lyme disease   . Shortness of breath dyspnea    from inactivity  . Wears dentures    full upper, partial lower (doesn't wear lower)    There are no active problems to display for this patient.   Past Surgical History:  Procedure Laterality Date  . ABDOMINAL HYSTERECTOMY  1984  . BACK SURGERY    . BREAST SURGERY Right    lumpectomy  . FOOT SURGERY Left    x2  . PREAURICULAR CYST EXCISION Left 10/31/2015   Procedure: EXCISION POST AURICULAR CYST ADULT;  Surgeon: Clyde Canterbury, MD;  Location: [place];  Service: ENT;  Laterality: Left;  . SEPTOPLASTY  2008   [facility], Dr. Kathyrn Sheriff  . TONSILLECTOMY N/A 10/31/2015   Procedure: TONSILLECTOMY;  Surgeon: Clyde Canterbury, MD;  Location: [place];  Service: ENT;  Laterality: N/A;    Prior to Admission medications   Medication Sig Start Date End Date Taking? Authorizing Provider  acetaminophen-codeine (TYLENOL #3) 300-30 MG tablet Take 1 tablet by mouth every 6 (six) hours as needed for moderate pain. 10/23/16   Hyatt, Max T, DPM  ALPRAZolam (XANAX) 0.5 MG tablet Take 0.5 mg by mouth 2 (two) times  daily.    [provider]  Aspirin-Salicylamide-Caffeine (BC HEADACHE POWDER PO) Take by mouth 4 (four) times daily as needed.    [provider]  Cyanocobalamin (VITAMIN B-12 PO) Take by mouth daily.    [provider]  cyclobenzaprine (FLEXERIL) 10 MG tablet Take 10 mg by mouth 3 (three) times daily as needed for muscle spasms.    [provider]  esomeprazole (NEXIUM) 40 MG capsule Take 40 mg by mouth daily at 12 noon.    [provider]  ezetimibe (ZETIA) 10 MG tablet  06/23/17   [provider]  fluticasone (FLONASE) 50 MCG/ACT nasal spray Place 2 sprays into both nostrils daily. AM    [provider]  levothyroxine (SYNTHROID, LEVOTHROID) 100 MCG tablet Take 100 mcg by mouth daily before breakfast.    [provider]  Linaclotide (LINZESS) 290 MCG CAPS capsule Take 290 mcg by mouth daily.    [provider]  MAGNESIUM PO Take by mouth daily.    [provider]  montelukast (SINGULAIR) 10 MG tablet Take 10 mg by mouth at bedtime.    [provider]  promethazine (PHENERGAN) 25 MG tablet Take 1 tablet (25 mg total) by mouth every 8 (eight) hours as needed for nausea or vomiting. 05/08/17   Hyatt, Max T, DPM  traMADol (ULTRAM) 50 MG tablet Take 50 mg by mouth every 6 (six) hours as needed.  [provider]  traZODone (DESYREL) 100 MG tablet Take 100 mg by mouth at bedtime as needed for sleep.    [provider]    Allergies  Allergen Reactions  . Amitriptyline Shortness Of Breath    And rash  . Nortriptyline Shortness Of Breath    And rash  . Baclofen Hives  . Bactrim [Sulfamethoxazole-Trimethoprim] Hives  . Codeine Hives  . Esomeprazole Swelling    Angioedema (generic only)  . Ibuprofen Swelling    Angioedema and rash  . Penicillins Hives  . Remeron [Mirtazapine] Hives  . Sulfa Antibiotics Swelling    angioedema  . Ultram [Tramadol Hcl] Swelling    Angioedema  (generic only)  . Vicodin [Hydrocodone-Acetaminophen] Hives  . Wellbutrin [Bupropion]     Altered mental status  . Zoloft [Sertraline Hcl] Hives  . Neurontin [Gabapentin] Rash    No family history on file.  Social History Social History   Tobacco Use  . Smoking status: Current Every Day Smoker    Packs/day: 0.75    Years: 40.00    Pack years: 30.00    Types: Cigarettes  . Smokeless tobacco: Never Used  Substance Use Topics  . Alcohol use: No    Frequency: Never  . Drug use: No    Review of Systems  Constitutional: Low-grade fevers. Eyes: Negative for visual changes. ENT: Negative for sore throat. Cardiovascular: Negative for chest pain. Respiratory: Negative for shortness of breath. Gastrointestinal: Positive for abdominal pain as per HPI. Genitourinary: Negative for dysuria. Musculoskeletal: Negative for back pain. Skin: Negative for rash. Neurological: Negative for headache.  ____________________________________________   PHYSICAL EXAM:  VITAL SIGNS: ED Triage Vitals  Enc Vitals Group     BP 08/15/17 1748 (!) 143/90     Pulse Rate 08/15/17 1748 (!) 102     Resp 08/15/17 1748 (!) 24     Temp 08/15/17 1745 100 F (37.8 C)     Temp Source 08/15/17 1745 Oral     SpO2 08/15/17 1748 97 %     Weight 08/15/17 1748 149 lb (67.6 kg)     Height 08/15/17 1748 5\' 7"  (1.702 m)     Head Circumference --      Peak Flow --      Pain Score 08/15/17 1748 10     Pain Loc --      Pain Edu? --      Excl. in [place]? --      Constitutional: Alert and oriented. Well appearing and in no distress. HEENT   Head: Normocephalic and atraumatic.      Eyes: Conjunctivae are normal. Pupils equal and round.       Ears:         Nose: No congestion/rhinnorhea.   Mouth/Throat: Mucous membranes are moist.   Neck: No stridor. Cardiovascular/Chest: Normal rate, regular rhythm.  No murmurs, rubs, or gallops. Respiratory: Normal respiratory effort without tachypnea nor  retractions. Breath sounds are clear and equal bilaterally. No wheezes/rales/rhonchi. Gastrointestinal: Soft. No distention..  Moderate to severe tenderness in the mid abdomen and the right side especially into the right lower quadrant at McBurney's point.  Localized guarding and rebound. Genitourinary/rectal:Deferred Musculoskeletal: Nontender with normal range of motion in all extremities. No joint effusions.  No lower extremity tenderness.  No edema. Neurologic:  Normal speech and language. No gross or focal neurologic deficits are appreciated. Skin:  Skin is warm, dry and intact. No rash noted. Psychiatric: Mood and affect are normal. Speech and behavior are normal.  Patient exhibits appropriate insight and judgment.   ____________________________________________  LABS (pertinent positives/negatives) I, Lisa Roca, MD the attending physician have reviewed the labs noted below.  Labs Reviewed  COMPREHENSIVE METABOLIC PANEL - Abnormal; Notable for the following components:      Result Value   Sodium 134 (*)    CO2 19 (*)    Glucose, Bld 109 (*)    Calcium 8.7 (*)    All other components within normal limits  CBC - Abnormal; Notable for the following components:   WBC 15.4 (*)    RDW 14.8 (*)    All other components within normal limits  URINALYSIS, COMPLETE (UACMP) WITH MICROSCOPIC - Abnormal; Notable for the following components:   Color, Urine STRAW (*)    APPearance CLEAR (*)    All other components within normal limits  LIPASE, BLOOD    ____________________________________________    EKG I, Lisa Roca, MD, the attending physician have personally viewed and interpreted all ECGs.  None ____________________________________________  RADIOLOGY All Xrays were viewed by me.  Imaging interpreted by Radiologist, and I, Lisa Roca, MD the attending physician have reviewed the radiologist interpretation noted below.  CT abdomen and pelvis with contrast:  Acute  appendicitis without abscess or evidence of perforation. Large appendicolith measures 8 x 14 mm. __________________________________________  PROCEDURES  Procedure(s) performed: None  Critical Care performed: None   ____________________________________________  ED COURSE / ASSESSMENT AND PLAN  Pertinent labs & imaging results that were available during my care of the patient were reviewed by me and considered in my medical decision making (see chart for details).    Symptoms clinically concerning for acute appendicitis, although other intra-abdominal differential would still apply including abscess or diverticulitis or ovarian cyst or torsion.  Patient given symptomatic medications and plans were made for preparations to do CT.  CT shows acute appendicitis, I spoke with her on-call surgeon who will see the patient in the ER.  DIFFERENTIAL DIAGNOSIS: Differential diagnosis includes, but is not limited to, ovarian cyst, ovarian torsion, acute appendicitis, diverticulitis, urinary tract infection/pyelonephritis, endometriosis, bowel obstruction, colitis, renal colic, gastroenteritis, hernia, fibroids, endometriosis, pregnancy related pain including ectopic pregnancy, etc.   CONSULTATIONS:   Surgeon, Dr. Hampton Abbot   Patient / Family / Caregiver informed of clinical course, medical decision-making process, and agree with plan.     ___________________________________________   FINAL CLINICAL IMPRESSION(S) / ED DIAGNOSES   Final diagnoses:  Acute appendicitis, unspecified acute appendicitis type      ___________________________________________        Note: This dictation was prepared with Dragon dictation. Any transcriptional errors that result from this process are unintentional    Lisa Roca, MD 08/15/17 2018

## 2017-08-15 NOTE — Anesthesia Procedure Notes (Signed)
Procedure Name: Intubation Date/Time: 08/15/2017 11:28 PM Performed by: Nelda Marseille, CRNA Pre-anesthesia Checklist: Patient identified, Patient being monitored, Timeout performed, Emergency Drugs available and Suction available Patient Re-evaluated:Patient Re-evaluated prior to induction Oxygen Delivery Method: Circle system utilized Preoxygenation: Pre-oxygenation with 100% oxygen Induction Type: IV induction Ventilation: Mask ventilation without difficulty Laryngoscope Size: Mac and 3 Grade View: Grade III Tube type: Oral Tube size: 7.0 mm Number of attempts: 1 Airway Equipment and Method: Stylet Placement Confirmation: ETT inserted through vocal cords under direct vision,  positive ETCO2 and breath sounds checked- equal and bilateral Secured at: 21 cm Tube secured with: Tape Dental Injury: Teeth and Oropharynx as per pre-operative assessment

## 2017-08-15 NOTE — H&P (Signed)
Date of Admission:  08/15/2017  Reason for Admission:  Acute appendicitis  History of Present Illness: Judy Salinas is a 61 y.o. female who presents with a 3-4 day history of abdominal pain.  Patient reports that she started having pain on the night of 12/25.  The pain was starting as epigastric in location and the patient thought that she had reflux issues.  However the pain subsequently started progressively moving towards the right lower quadrant and becoming worse in intensity.  She does have a history of IBS with constipation and intermittent diarrhea and at first was not sure if that was the cause of her problems.  However the pain continued and it intensified particularly last night she was not able to sleep because of the pain and today she was bent over due to the pain.  She decided to come to the emergency room for further evaluation today.  She reports having nausea today and one episode of emesis last night.  She has felt febrile but did not check her temperature.  Denies any chills or shakes, denies any chest pain or shortness of breath although does feel that she gets worse pain in her right lower side when she tries to take a deep breath.  She did try taking her usual medication for fibromyalgia but these did not help at all for her pain.  Past Medical History: Past Medical History:  Diagnosis Date  . Arthritis    hands, feet  . Chronic fatigue   . Fibromyalgia    s/p lyme disease  . GERD (gastroesophageal reflux disease)   . IBS (irritable bowel syndrome)   . Lyme disease   . Shortness of breath dyspnea    from inactivity  . Wears dentures    full upper, partial lower (doesn't wear lower)     Past Surgical History: Past Surgical History:  Procedure Laterality Date   Cholecystectomy    . ABDOMINAL HYSTERECTOMY  1984  . BACK SURGERY    . BREAST SURGERY Right    lumpectomy  . FOOT SURGERY Left    x2  . PREAURICULAR CYST EXCISION Left 10/31/2015   Procedure:  EXCISION POST AURICULAR CYST ADULT;  Surgeon: Clyde Canterbury, MD;  Location: [place];  Service: ENT;  Laterality: Left;  . SEPTOPLASTY  2008   [facility], Dr. Kathyrn Sheriff  . TONSILLECTOMY N/A 10/31/2015   Procedure: TONSILLECTOMY;  Surgeon: Clyde Canterbury, MD;  Location: [place];  Service: ENT;  Laterality: N/A;    Home Medications: Prior to Admission medications   Medication Sig Start Date End Date Taking? Authorizing Provider  acetaminophen-codeine (TYLENOL #3) 300-30 MG tablet Take 1 tablet by mouth every 6 (six) hours as needed for moderate pain. 10/23/16  Yes Hyatt, Max T, DPM  ALPRAZolam (XANAX) 0.5 MG tablet Take 0.5 mg by mouth 2 (two) times daily as needed for anxiety.    Yes [provider]  Aspirin-Salicylamide-Caffeine (BC HEADACHE POWDER PO) Take by mouth 4 (four) times daily as needed.   Yes [provider]  cyclobenzaprine (FLEXERIL) 10 MG tablet Take 10 mg by mouth 3 (three) times daily as needed for muscle spasms.   Yes [provider]  esomeprazole (NEXIUM) 40 MG capsule Take 40 mg by mouth daily at 12 noon.   Yes [provider]  ezetimibe (ZETIA) 10 MG tablet Take 10 mg by mouth at bedtime.  06/23/17  Yes [provider]  fluticasone (FLONASE) 50 MCG/ACT nasal spray Place 2 sprays into both  nostrils daily. AM   Yes [provider]  Linaclotide (LINZESS) 290 MCG CAPS capsule Take 290 mcg by mouth daily.   Yes [provider]  MAGNESIUM PO Take 500 mg by mouth daily.    Yes [provider]  montelukast (SINGULAIR) 10 MG tablet Take 10 mg by mouth at bedtime.   Yes [provider]  promethazine (PHENERGAN) 25 MG tablet Take 1 tablet (25 mg total) by mouth every 8 (eight) hours as needed for nausea or vomiting. 05/08/17  Yes Hyatt, Max T, DPM  senna-docusate (SENOKOT-S) 8.6-50 MG tablet Take 1 tablet by mouth daily.   Yes [provider]  SYNTHROID 137 MCG tablet Take 137 mcg by mouth  daily before breakfast.   Yes [provider]  traMADol (ULTRAM) 50 MG tablet Take 50 mg by mouth every 6 (six) hours as needed.   Yes [provider]  traZODone (DESYREL) 100 MG tablet Take 100 mg by mouth at bedtime as needed for sleep.   Yes [provider]    Allergies: Allergies  Allergen Reactions  . Amitriptyline Shortness Of Breath    And rash  . Nortriptyline Shortness Of Breath    And rash  . Baclofen Hives  . Bactrim [Sulfamethoxazole-Trimethoprim] Hives  . Codeine Hives  . Esomeprazole Swelling    Angioedema (generic only)  . Ibuprofen Swelling    Angioedema and rash  . Penicillins Hives    Has patient had a PCN reaction causing immediate rash, facial/tongue/throat swelling, SOB or lightheadedness with hypotension: Yes Has patient had a PCN reaction causing severe rash involving mucus membranes or skin necrosis: Unknown Has patient had a PCN reaction that required hospitalization: No Has patient had a PCN reaction occurring within the last 10 years: No If all of the above answers are "NO", then may proceed with Cephalosporin use.   . Remeron [Mirtazapine] Hives  . Sulfa Antibiotics Swelling    angioedema  . Ultram [Tramadol Hcl] Swelling    Angioedema (generic only)  . Vicodin [Hydrocodone-Acetaminophen] Hives  . Wellbutrin [Bupropion]     Altered mental status  . Zoloft [Sertraline Hcl] Hives  . Neurontin [Gabapentin] Rash    Social History:  reports that she has been smoking cigarettes.  She has a 30.00 pack-year smoking history. she has never used smokeless tobacco. She reports that she does not drink alcohol or use drugs.   Family History: History of thyroid cancer  Review of Systems: Review of Systems  Constitutional: Positive for fever. Negative for chills.  HENT: Negative for hearing loss.   Eyes: Negative for blurred vision.  Respiratory: Negative for shortness of breath.   Cardiovascular: Negative for chest pain.   Gastrointestinal: Positive for abdominal pain, nausea and vomiting. Negative for constipation and diarrhea.  Genitourinary: Negative for dysuria.  Musculoskeletal: Negative for myalgias.  Skin: Negative for rash.  Neurological: Negative for dizziness.  Psychiatric/Behavioral: Negative for depression.  All other systems reviewed and are negative.   Physical Exam BP 119/71   Pulse (!) 112   Temp 100 F (37.8 C) (Oral)   Resp (!) 26   Ht 5\' 7"  (1.702 m)   Wt 67.6 kg (149 lb)   SpO2 95%   BMI 23.34 kg/m  CONSTITUTIONAL: No acute distress but does appear to have some pain HEENT:  Normocephalic, atraumatic, extraocular motion intact. NECK: Trachea is midline, and there is no jugular venous distension.  RESPIRATORY:  Lungs are clear, and breath sounds are equal bilaterally. Normal respiratory effort  without pathologic use of accessory muscles. CARDIOVASCULAR: Heart is regular rhythm with low-grade tachycardia without murmurs, gallops, or rubs. GI: The abdomen is soft, nondistended, with tenderness to palpation in the right lower quadrant at McBurney's point. There were no palpable masses.  MUSCULOSKELETAL:  Normal muscle strength and tone in all four extremities.  No peripheral edema or cyanosis. SKIN: Skin turgor is normal. There are no pathologic skin lesions.  NEUROLOGIC:  Motor and sensation is grossly normal.  Cranial nerves are grossly intact. PSYCH:  Alert and oriented to person, place and time. Affect is normal.  Laboratory Analysis: Results for orders placed or performed during the hospital encounter of 08/15/17 (from the past 24 hour(s))  Lipase, blood     Status: None   Collection Time: 08/15/17  5:54 PM  Result Value Ref Range   Lipase 22 11 - 51 U/L  Comprehensive metabolic panel     Status: Abnormal   Collection Time: 08/15/17  5:54 PM  Result Value Ref Range   Sodium 134 (L) 135 - 145 mmol/L   Potassium 3.5 3.5 - 5.1 mmol/L   Chloride 104 101 - 111 mmol/L   CO2 19  (L) 22 - 32 mmol/L   Glucose, Bld 109 (H) 65 - 99 mg/dL   BUN 11 6 - 20 mg/dL   Creatinine, Ser 0.84 0.44 - 1.00 mg/dL   Calcium 8.7 (L) 8.9 - 10.3 mg/dL   Total Protein 7.1 6.5 - 8.1 g/dL   Albumin 3.7 3.5 - 5.0 g/dL   AST 39 15 - 41 U/L   ALT 17 14 - 54 U/L   Alkaline Phosphatase 97 38 - 126 U/L   Total Bilirubin 0.3 0.3 - 1.2 mg/dL   GFR calc non Af Amer >60 >60 mL/min   GFR calc Af Amer >60 >60 mL/min   Anion gap 11 5 - 15  CBC     Status: Abnormal   Collection Time: 08/15/17  5:54 PM  Result Value Ref Range   WBC 15.4 (H) 3.6 - 11.0 K/uL   RBC 4.35 3.80 - 5.20 MIL/uL   Hemoglobin 12.1 12.0 - 16.0 g/dL   HCT 36.7 35.0 - 47.0 %   MCV 84.4 80.0 - 100.0 fL   MCH 27.7 26.0 - 34.0 pg   MCHC 32.8 32.0 - 36.0 g/dL   RDW 14.8 (H) 11.5 - 14.5 %   Platelets 271 150 - 440 K/uL  Urinalysis, Complete w Microscopic     Status: Abnormal   Collection Time: 08/15/17  5:55 PM  Result Value Ref Range   Color, Urine STRAW (A) YELLOW   APPearance CLEAR (A) CLEAR   Specific Gravity, Urine 1.015 1.005 - 1.030   pH 6.0 5.0 - 8.0   Glucose, UA NEGATIVE NEGATIVE mg/dL   Hgb urine dipstick NEGATIVE NEGATIVE   Bilirubin Urine NEGATIVE NEGATIVE   Ketones, ur NEGATIVE NEGATIVE mg/dL   Protein, ur NEGATIVE NEGATIVE mg/dL   Nitrite NEGATIVE NEGATIVE   Leukocytes, UA NEGATIVE NEGATIVE   RBC / HPF NONE SEEN 0 - 5 RBC/hpf   WBC, UA 0-5 0 - 5 WBC/hpf   Bacteria, UA NONE SEEN NONE SEEN   Squamous Epithelial / LPF NONE SEEN NONE SEEN   Mucus PRESENT     Imaging: Ct Abdomen Pelvis W Contrast  Result Date: 08/15/2017 CLINICAL DATA:  Abdominal pain with nausea and vomiting EXAM: CT ABDOMEN AND PELVIS WITH CONTRAST TECHNIQUE: Multidetector CT imaging of the abdomen and pelvis was performed using the standard  protocol following bolus administration of intravenous contrast. CONTRAST:  123mL ISOVUE-300 IOPAMIDOL (ISOVUE-300) INJECTION 61% COMPARISON:  CT abdomen pelvis 09/30/2013 FINDINGS: Lower chest:  Bibasilar atelectasis. Hepatobiliary: Normal hepatic contours and density. No visible biliary dilatation. Status post cholecystectomy. Pancreas: The pancreas is small, with mildly decreased attenuation. No peripancreatic fluid collection. Spleen: Normal. Adrenals/Urinary Tract: --Adrenal glands: Normal. --Right kidney/ureter: No hydronephrosis or perinephric stranding. No nephrolithiasis. No obstructing ureteral stones. --Left kidney/ureter: No hydronephrosis or perinephric stranding. No nephrolithiasis. No obstructing ureteral stones. --Urinary bladder: Unremarkable. Stomach/Bowel: --Stomach/Duodenum: No hiatal hernia or other gastric abnormality. Normal duodenal course and caliber. --Small bowel: No dilatation or inflammation. --Colon: No focal abnormality. --Appendix: Location: Inferior to the cecum Diameter: 9 mm Appendicolith: Yes, measuring 8 x 14 mm Mucosal hyper-enhancement: Yes Extraluminal gas: No Periappendiceal collection: No Vascular/Lymphatic: Normal course and caliber of the major abdominal vessels. No abdominal or pelvic lymphadenopathy. Reproductive: Status post hysterectomy. No adnexal mass. Musculoskeletal. No bony spinal canal stenosis or focal osseous abnormality. Other: None. IMPRESSION: Acute appendicitis without abscess or evidence of perforation. Large appendicolith measures 8 x 14 mm. Electronically Signed   By: Ulyses Jarred M.D.   On: 08/15/2017 19:41    Assessment and Plan: This is a 61 y.o. female who presents with acute appendicitis.  I have independently reviewed the patient's imaging study as well as reviewed the patient's laboratory studies.  Overall she does have acute appendicitis with an appendicolith at the base of the appendix and periappendiceal stranding.  There is no true evidence of perforation but there is a lot of inflammation at the base of the appendix.  Her white blood cell count is elevated 15.4.  Patient will be admitted to the surgical service tonight and will  be taken to the operating room for laparoscopic appendectomy.  Risk of bleeding, infection, injury to surrounding structures were explained to the patient.  Patient understands that currently there is no evidence of perforation on CT scan but if there is intraoperatively, that we may leave a drain temporarily.  In the meantime she will be n.p.o., IV fluid hydration, appropriate pain control, she will be started on IV antibiotics particularly ciprofloxacin and Flagyl given her penicillin allergy.  Patient understands this plan and all of her questions have been answered.     Melvyn Neth, [place]

## 2017-08-15 NOTE — ED Triage Notes (Signed)
Pt comes from home via [facility].  Pt having RLQ abdominal pain with n/v x3 days.  Pt does not have gall bladder, but does still have appendix at this time.  Pt is A&Ox4.  Per EMS, pt was 99.1 oral, 148/83, and pulse 110.

## 2017-08-15 NOTE — ED Triage Notes (Signed)
FIRST NURSE NOTE-c/o lower abdominal cramping X 1 year but getting worse over last 2 months.

## 2017-08-15 NOTE — ED Notes (Signed)
Per pt she has had morphine in the past and she can tolerate it.  Pt also states that she is currently on tylenol 3 with codeine and can tolerate that due to it being a small dose.  Will monitor closely.

## 2017-08-15 NOTE — Anesthesia Preprocedure Evaluation (Signed)
Anesthesia Evaluation  Patient identified by MRN, date of birth, ID band Patient awake    Reviewed: Allergy & Precautions, H&P , NPO status , Patient's Chart, lab work & pertinent test results  History of Anesthesia Complications Negative for: history of anesthetic complications  Airway Mallampati: III  TM Distance: >3 FB Neck ROM: limited    Dental  (+) Chipped, Poor Dentition, Missing, Edentulous Upper   Pulmonary shortness of breath and with exertion, COPD,  COPD inhaler, Current Smoker,           Cardiovascular (-) angina(-) Past MI and (-) DOE negative cardio ROS       Neuro/Psych  Neuromuscular disease negative psych ROS   GI/Hepatic Neg liver ROS, GERD  Medicated and Controlled,  Endo/Other  negative endocrine ROS  Renal/GU      Musculoskeletal  (+) Arthritis , Fibromyalgia -  Abdominal   Peds  Hematology negative hematology ROS (+)   Anesthesia Other Findings Past Medical History: No date: Arthritis     Comment:  hands, feet No date: Chronic fatigue No date: Fibromyalgia     Comment:  s/p lyme disease No date: GERD (gastroesophageal reflux disease) No date: IBS (irritable bowel syndrome) No date: Lyme disease No date: Shortness of breath dyspnea     Comment:  from inactivity No date: Wears dentures     Comment:  full upper, partial lower (doesn't wear lower)  Past Surgical History: 1984: ABDOMINAL HYSTERECTOMY No date: BACK SURGERY No date: BREAST SURGERY; Right     Comment:  lumpectomy No date: FOOT SURGERY; Left     Comment:  x2 10/31/2015: PREAURICULAR CYST EXCISION; Left     Comment:  Procedure: EXCISION POST AURICULAR CYST ADULT;  Surgeon:              Clyde Canterbury, MD;  Location: [place];                Service: ENT;  Laterality: Left; 2008: SEPTOPLASTY     Comment:  [facility], Dr. Kathyrn Sheriff 10/31/2015: TONSILLECTOMY; N/A     Comment:  Procedure: TONSILLECTOMY;  Surgeon: Clyde Canterbury, MD;                Location: [facility];  Service: ENT;                Laterality: N/A;  BMI    Body Mass Index:  23.34 kg/m      Reproductive/Obstetrics negative OB ROS                             Anesthesia Physical Anesthesia Plan  ASA: III  Anesthesia Plan: General ETT, Rapid Sequence and Cricoid Pressure   Post-op Pain Management:    Induction: Intravenous  PONV Risk Score and Plan: Ondansetron, Midazolam and Dexamethasone  Airway Management Planned: Oral ETT  Additional Equipment:   Intra-op Plan:   Post-operative Plan: Extubation in OR  Informed Consent: I have reviewed the patients History and Physical, chart, labs and discussed the procedure including the risks, benefits and alternatives for the proposed anesthesia with the patient or authorized representative who has indicated his/her understanding and acceptance.   Dental Advisory Given  Plan Discussed with: Anesthesiologist, CRNA and Surgeon  Anesthesia Plan Comments: (Patient consented for risks of anesthesia including but not limited to:  - adverse reactions to medications - damage to teeth, lips or other oral mucosa - sore throat or hoarseness - Damage to  heart, brain, lungs or loss of life  Patient voiced understanding.)        Anesthesia Quick Evaluation

## 2017-08-16 ENCOUNTER — Other Ambulatory Visit: Payer: Self-pay

## 2017-08-16 DIAGNOSIS — K3580 Unspecified acute appendicitis: Secondary | ICD-10-CM | POA: Diagnosis not present

## 2017-08-16 DIAGNOSIS — K3533 Acute appendicitis with perforation and localized peritonitis, with abscess: Secondary | ICD-10-CM | POA: Diagnosis not present

## 2017-08-16 LAB — CBC
HCT: 34.1 % — ABNORMAL LOW (ref 35.0–47.0)
HEMOGLOBIN: 11.4 g/dL — AB (ref 12.0–16.0)
MCH: 28.1 pg (ref 26.0–34.0)
MCHC: 33.4 g/dL (ref 32.0–36.0)
MCV: 84.2 fL (ref 80.0–100.0)
Platelets: 235 10*3/uL (ref 150–440)
RBC: 4.05 MIL/uL (ref 3.80–5.20)
RDW: 14.6 % — ABNORMAL HIGH (ref 11.5–14.5)
WBC: 13.4 10*3/uL — ABNORMAL HIGH (ref 3.6–11.0)

## 2017-08-16 LAB — BASIC METABOLIC PANEL
ANION GAP: 6 (ref 5–15)
BUN: 10 mg/dL (ref 6–20)
CALCIUM: 8.5 mg/dL — AB (ref 8.9–10.3)
CO2: 24 mmol/L (ref 22–32)
Chloride: 103 mmol/L (ref 101–111)
Creatinine, Ser: 0.86 mg/dL (ref 0.44–1.00)
Glucose, Bld: 162 mg/dL — ABNORMAL HIGH (ref 65–99)
POTASSIUM: 4.3 mmol/L (ref 3.5–5.1)
SODIUM: 133 mmol/L — AB (ref 135–145)

## 2017-08-16 LAB — MAGNESIUM: MAGNESIUM: 2.2 mg/dL (ref 1.7–2.4)

## 2017-08-16 MED ORDER — ALPRAZOLAM 0.5 MG PO TABS: 0.5000 mg | ORAL_TABLET | Freq: Two times a day (BID) | ORAL | Status: DC | PRN

## 2017-08-16 MED ORDER — ACETAMINOPHEN 10 MG/ML IV SOLN
INTRAVENOUS | Status: DC | PRN
Start: 2017-08-16 — End: 2017-08-16
  Administered 2017-08-16: 1000 mg via INTRAVENOUS

## 2017-08-16 MED ORDER — BUPIVACAINE-EPINEPHRINE (PF) 0.5% -1:200000 IJ SOLN
INTRAMUSCULAR | Status: DC | PRN
  Administered 2017-08-16: 30 mL via PERINEURAL

## 2017-08-16 MED ORDER — OXYCODONE HCL 5 MG PO TABS: 5.0000 mg | ORAL_TABLET | Freq: Once | ORAL | Status: DC | PRN

## 2017-08-16 MED ORDER — TRAMADOL HCL 50 MG PO TABS
50.0000 mg | ORAL_TABLET | Freq: Four times a day (QID) | ORAL | Status: DC | PRN
  Administered 2017-08-16: 50 mg via ORAL
  Filled 2017-08-16: qty 1

## 2017-08-16 MED ORDER — METRONIDAZOLE 500 MG PO TABS: 500.0000 mg | ORAL_TABLET | Freq: Three times a day (TID) | ORAL | 1 refills | Status: DC

## 2017-08-16 MED ORDER — CIPROFLOXACIN HCL 500 MG PO TABS: 500.0000 mg | ORAL_TABLET | Freq: Two times a day (BID) | ORAL | 1 refills | Status: DC

## 2017-08-16 MED ORDER — ROCURONIUM BROMIDE 50 MG/5ML IV SOLN
INTRAVENOUS | Status: AC
  Filled 2017-08-16: qty 1

## 2017-08-16 MED ORDER — FENTANYL CITRATE (PF) 100 MCG/2ML IJ SOLN
25.0000 ug | INTRAMUSCULAR | Status: DC | PRN
Start: 2017-08-16 — End: 2017-08-16

## 2017-08-16 MED ORDER — HYDROMORPHONE HCL 1 MG/ML IJ SOLN
INTRAMUSCULAR | Status: AC
  Administered 2017-08-16: 0.5 mg via INTRAVENOUS
  Filled 2017-08-16: qty 1

## 2017-08-16 MED ORDER — LINACLOTIDE 290 MCG PO CAPS
290.0000 ug | ORAL_CAPSULE | Freq: Every day | ORAL | Status: DC
  Administered 2017-08-16: 290 ug via ORAL
  Filled 2017-08-16: qty 1

## 2017-08-16 MED ORDER — CYCLOBENZAPRINE HCL 10 MG PO TABS
10.0000 mg | ORAL_TABLET | Freq: Three times a day (TID) | ORAL | Status: DC | PRN
  Administered 2017-08-16: 10 mg via ORAL
  Filled 2017-08-16: qty 1

## 2017-08-16 MED ORDER — SUGAMMADEX SODIUM 200 MG/2ML IV SOLN
INTRAVENOUS | Status: DC | PRN
  Administered 2017-08-16: 135.2 mg via INTRAVENOUS

## 2017-08-16 MED ORDER — MONTELUKAST SODIUM 10 MG PO TABS: 10.0000 mg | ORAL_TABLET | Freq: Every day | ORAL | Status: DC

## 2017-08-16 MED ORDER — LEVOTHYROXINE SODIUM 137 MCG PO TABS
137.0000 ug | ORAL_TABLET | Freq: Every day | ORAL | Status: DC
  Administered 2017-08-16: 137 ug via ORAL
  Filled 2017-08-16: qty 1

## 2017-08-16 MED ORDER — OXYCODONE-ACETAMINOPHEN 5-325 MG PO TABS: 1.0000 | ORAL_TABLET | ORAL | 0 refills | Status: DC | PRN

## 2017-08-16 MED ORDER — OXYCODONE HCL 5 MG/5ML PO SOLN: 5.0000 mg | Freq: Once | ORAL | Status: DC | PRN

## 2017-08-16 MED ORDER — HYDROMORPHONE HCL 1 MG/ML IJ SOLN
0.2500 mg | INTRAMUSCULAR | Status: DC | PRN
  Administered 2017-08-16 (×2): 0.5 mg via INTRAVENOUS

## 2017-08-16 MED ORDER — OXYCODONE HCL 5 MG PO TABS: 5.0000 mg | ORAL_TABLET | ORAL | Status: DC | PRN

## 2017-08-16 MED ORDER — TRAZODONE HCL 100 MG PO TABS: 100.0000 mg | ORAL_TABLET | Freq: Every evening | ORAL | Status: DC | PRN

## 2017-08-16 MED ORDER — SUGAMMADEX SODIUM 200 MG/2ML IV SOLN
INTRAVENOUS | Status: AC
  Filled 2017-08-16: qty 2

## 2017-08-16 MED ORDER — FLUTICASONE PROPIONATE 50 MCG/ACT NA SUSP
2.0000 | Freq: Every day | NASAL | Status: DC
  Administered 2017-08-16: 2 via NASAL
  Filled 2017-08-16: qty 16

## 2017-08-16 MED ORDER — EZETIMIBE 10 MG PO TABS
10.0000 mg | ORAL_TABLET | Freq: Every day | ORAL | Status: DC
  Filled 2017-08-16: qty 1

## 2017-08-16 NOTE — Discharge Instructions (Signed)
May shower Advance to regular diet Follow-up with Dr. Hampton Abbot in 2 weeks Resume home medications as directed Oral antibiotics as prescribed

## 2017-08-16 NOTE — Progress Notes (Signed)
1 Day Post-Op  Subjective: Patient status post appendectomy.  She feels well and wants to go home today.  She has not had a diet yet today.  Objective: Vital signs in last 24 hours: Temp:  [98 F (36.7 C)-100 F (37.8 C)] 98 F (36.7 C) (12/29 0406) Pulse Rate:  [73-112] 73 (12/29 0406) Resp:  [13-26] 18 (12/29 0406) BP: (93-157)/(52-91) 100/53 (12/29 0406) SpO2:  [92 %-99 %] 97 % (12/29 0406) Weight:  [149 lb (67.6 kg)-158 lb 1.6 oz (71.7 kg)] 158 lb 1.6 oz (71.7 kg) (12/29 0147) Last BM Date: 08/15/17  Intake/Output from previous day: 12/28 0701 - 12/29 0700 In: 2125 [I.V.:2025; IV Piggyback:100] Out: 530 [Urine:525; Blood:5] Intake/Output this shift: Total I/O In: 785 [I.V.:785] Out: -   Physical exam:  Abdomen is soft nontender wounds are dressed with Dermabond.  Lab Results: CBC  Recent Labs    08/15/17 1754 08/16/17 0503  WBC 15.4* 13.4*  HGB 12.1 11.4*  HCT 36.7 34.1*  PLT 271 235   BMET Recent Labs    08/15/17 1754 08/16/17 0503  NA 134* 133*  K 3.5 4.3  CL 104 103  CO2 19* 24  GLUCOSE 109* 162*  BUN 11 10  CREATININE 0.84 0.86  CALCIUM 8.7* 8.5*   PT/INR No results for input(s): LABPROT, INR in the last 72 hours. ABG No results for input(s): PHART, HCO3 in the last 72 hours.  Invalid input(s): PCO2, PO2  Studies/Results: Ct Abdomen Pelvis W Contrast  Result Date: 08/15/2017 CLINICAL DATA:  Abdominal pain with nausea and vomiting EXAM: CT ABDOMEN AND PELVIS WITH CONTRAST TECHNIQUE: Multidetector CT imaging of the abdomen and pelvis was performed using the standard protocol following bolus administration of intravenous contrast. CONTRAST:  135mL ISOVUE-300 IOPAMIDOL (ISOVUE-300) INJECTION 61% COMPARISON:  CT abdomen pelvis 09/30/2013 FINDINGS: Lower chest: Bibasilar atelectasis. Hepatobiliary: Normal hepatic contours and density. No visible biliary dilatation. Status post cholecystectomy. Pancreas: The pancreas is small, with mildly decreased  attenuation. No peripancreatic fluid collection. Spleen: Normal. Adrenals/Urinary Tract: --Adrenal glands: Normal. --Right kidney/ureter: No hydronephrosis or perinephric stranding. No nephrolithiasis. No obstructing ureteral stones. --Left kidney/ureter: No hydronephrosis or perinephric stranding. No nephrolithiasis. No obstructing ureteral stones. --Urinary bladder: Unremarkable. Stomach/Bowel: --Stomach/Duodenum: No hiatal hernia or other gastric abnormality. Normal duodenal course and caliber. --Small bowel: No dilatation or inflammation. --Colon: No focal abnormality. --Appendix: Location: Inferior to the cecum Diameter: 9 mm Appendicolith: Yes, measuring 8 x 14 mm Mucosal hyper-enhancement: Yes Extraluminal gas: No Periappendiceal collection: No Vascular/Lymphatic: Normal course and caliber of the major abdominal vessels. No abdominal or pelvic lymphadenopathy. Reproductive: Status post hysterectomy. No adnexal mass. Musculoskeletal. No bony spinal canal stenosis or focal osseous abnormality. Other: None. IMPRESSION: Acute appendicitis without abscess or evidence of perforation. Large appendicolith measures 8 x 14 mm. Electronically Signed   By: Ulyses Jarred M.D.   On: 08/15/2017 19:41    Anti-infectives: Anti-infectives (From admission, onward)   Start     Dose/Rate Route Frequency Ordered Stop   08/16/17 0900  ciprofloxacin (CIPRO) IVPB 400 mg     400 mg 200 mL/hr over 60 Minutes Intravenous Every 12 hours 08/15/17 2118     08/16/17 0500  metroNIDAZOLE (FLAGYL) IVPB 500 mg     500 mg 100 mL/hr over 60 Minutes Intravenous Every 8 hours 08/15/17 2118     08/16/17 0000  ciprofloxacin (CIPRO) 500 MG tablet     500 mg Oral 2 times daily 08/16/17 0742     08/16/17 0000  metroNIDAZOLE (  FLAGYL) 500 MG tablet     500 mg Oral 3 times daily 08/16/17 0742     08/15/17 2115  ciprofloxacin (CIPRO) IVPB 400 mg     400 mg 200 mL/hr over 60 Minutes Intravenous  Once 08/15/17 2110 08/15/17 2321   08/15/17  2115  metroNIDAZOLE (FLAGYL) IVPB 500 mg     500 mg 100 mL/hr over 60 Minutes Intravenous  Once 08/15/17 2110 08/16/17 0322      Assessment/Plan: s/p Procedure(s): APPENDECTOMY LAPAROSCOPIC   Will advance diet likely discharge later today.  Florene Glen, MD, FACS  08/16/2017

## 2017-08-16 NOTE — Progress Notes (Signed)
Patient discharge teaching given, including activity, diet, follow-up appoints, and medications. Patient verbalized understanding of all discharge instructions. IV access was d/c'd. Vitals are stable. Skin is intact except as charted in most recent assessments. Pt to be escorted out by volunteer, to be driven home by family.  Judy Salinas  

## 2017-08-16 NOTE — Op Note (Signed)
  Procedure Date:  08/16/2017  Pre-operative Diagnosis:  Acute appendicitis  Post-operative Diagnosis:  Acute appendicitis  Procedure:  Laparoscopic appendectomy  Surgeon:  Melvyn Neth, MD  Anesthesia:  General endotracheal  Estimated Blood Loss:  10 ml  Specimens:  appendix  Complications:  None  Indications for Procedure:  This is a 61 y.o. female who presents with abdominal pain and workup revealing acute appendicitis.  The options of surgery versus observation were reviewed with the patient and/or family. The risks of bleeding, infection, recurrence of symptoms, negative laparoscopy, potential for an open procedure, bowel injury, abscess or infection, were all discussed with the patient and she was willing to proceed.  Description of Procedure: The patient was correctly identified in the preoperative area and brought into the operating room.  The patient was placed supine with VTE prophylaxis in place.  Appropriate time-outs were performed.  Anesthesia was induced and the patient was intubated.  Foley catheter was placed.  Appropriate antibiotics were infused.  The abdomen was prepped and draped in a sterile fashion. An infraumbilical incision was made. A cutdown technique was used to enter the abdominal cavity without injury, and a Hasson trocar was inserted.  Pneumoperitoneum was obtained with appropriate opening pressures.  Two 5-mm ports were placed in the suprapubic and left lateral positions under direct visualization.  The right lower quadrant was inspected and the appendix was identified and found to be acutely inflamed, with omentum wrapping around the appendix.  There was minimal fibrinopurulent fluid. The appendix was carefully dissected and was noted to have a small area of necrosis near the base, though no gross perforation was noted. The base of the appendix was dissected out and divided with a standard load Endo GIA. The mesoappendix was divided using the LigaSure.   The appendix was placed in an Endocatch bag and brought out through the umbilical incision.  The right lower quadrant was then inspected again revealing an intact staple line, no bleeding, and no bowel injury.  The area was thoroughly irrigated.  The 5 mm ports were removed under direct visualization and the Hasson trocar was removed.  The fascial opening was closed using 0 vicryl suture.  Local anesthetic was infused in all incisions and the incisions were closed with 4-0 Monocryl.  The wounds were cleaned and sealed with DermaBond.  Foley catheter was removed and the patient was emerged from anesthesia and extubated and brought to the recovery room for further management.  The patient tolerated the procedure well and all counts were correct at the end of the case.   Melvyn Neth, MD

## 2017-08-16 NOTE — Discharge Summary (Signed)
Physician Discharge Summary  Patient ID: Judy Salinas MRN: [id number] DOB/AGE: 59-Jun-1957 59 y.o.  Admit date: 08/15/2017 Discharge date: 08/16/2017   Discharge Diagnoses:  Active Problems:   Acute appendicitis   Procedures: Laparoscopic appendectomy  Hospital Course: Patient was admitted with a diagnosis of acute appendicitis.  She was taken the operating room where laparoscopic appendectomy confirmed the diagnosis.  She was treated with IV antibiotics and is transition to oral antibiotics and oral analgesics to follow-up in our office in 10 days she is tolerating a regular diet and has instructions to shower and leave her Dermabond in place.  Consults: None  Disposition: 01-Home or Self Care   Allergies as of 08/16/2017      Reactions   Amitriptyline Shortness Of Breath   And rash   Nortriptyline Shortness Of Breath   And rash   Baclofen Hives   Bactrim [sulfamethoxazole-trimethoprim] Hives   Codeine Hives   Esomeprazole Swelling   Angioedema (generic only)   Ibuprofen Swelling   Angioedema and rash   Penicillins Hives   Has patient had a PCN reaction causing immediate rash, facial/tongue/throat swelling, SOB or lightheadedness with hypotension: Yes Has patient had a PCN reaction causing severe rash involving mucus membranes or skin necrosis: Unknown Has patient had a PCN reaction that required hospitalization: No Has patient had a PCN reaction occurring within the last 10 years: No If all of the above answers are "NO", then may proceed with Cephalosporin use.   Remeron [mirtazapine] Hives   Sulfa Antibiotics Swelling   angioedema   Ultram [tramadol Hcl] Swelling   Angioedema (generic only)   Vicodin [hydrocodone-acetaminophen] Hives   Wellbutrin [bupropion]    Altered mental status   Zoloft [sertraline Hcl] Hives   Neurontin [gabapentin] Rash      Medication List    STOP taking these medications   BC HEADACHE POWDER PO     TAKE these medications    acetaminophen-codeine 300-30 MG tablet Commonly known as:  TYLENOL #3 Take 1 tablet by mouth every 6 (six) hours as needed for moderate pain.   ALPRAZolam 0.5 MG tablet Commonly known as:  XANAX Take 0.5 mg by mouth 2 (two) times daily as needed for anxiety.   ciprofloxacin 500 MG tablet Commonly known as:  CIPRO Take 1 tablet (500 mg total) by mouth 2 (two) times daily.   cyclobenzaprine 10 MG tablet Commonly known as:  FLEXERIL Take 10 mg by mouth 3 (three) times daily as needed for muscle spasms.   esomeprazole 40 MG capsule Commonly known as:  NEXIUM Take 40 mg by mouth daily at 12 noon.   ezetimibe 10 MG tablet Commonly known as:  ZETIA Take 10 mg by mouth at bedtime.   fluticasone 50 MCG/ACT nasal spray Commonly known as:  FLONASE Place 2 sprays into both nostrils daily. AM   LINZESS 290 MCG Caps capsule Generic drug:  linaclotide Take 290 mcg by mouth daily.   MAGNESIUM PO Take 500 mg by mouth daily.   metroNIDAZOLE 500 MG tablet Commonly known as:  FLAGYL Take 1 tablet (500 mg total) by mouth 3 (three) times daily.   montelukast 10 MG tablet Commonly known as:  SINGULAIR Take 10 mg by mouth at bedtime.   oxyCODONE-acetaminophen 5-325 MG tablet Commonly known as:  ROXICET Take 1 tablet by mouth every 4 (four) hours as needed for severe pain.   promethazine 25 MG tablet Commonly known as:  PHENERGAN Take 1 tablet (25 mg total) by mouth every 8 (  eight) hours as needed for nausea or vomiting.   senna-docusate 8.6-50 MG tablet Commonly known as:  Senokot-S Take 1 tablet by mouth daily.   SYNTHROID 137 MCG tablet Generic drug:  levothyroxine Take 137 mcg by mouth daily before breakfast.   traMADol 50 MG tablet Commonly known as:  ULTRAM Take 50 mg by mouth every 6 (six) hours as needed.   traZODone 100 MG tablet Commonly known as:  DESYREL Take 100 mg by mouth at bedtime as needed for sleep.      Follow-up Information    Piscoya, Jacqulyn Bath, MD.  Schedule an appointment as soon as possible for a visit in 2 week(s).   Specialty:  Surgery Contact information: [place] [address] [phone]           Florene Glen, MD, FACS

## 2017-08-16 NOTE — Anesthesia Postprocedure Evaluation (Signed)
Anesthesia Post Note  Patient: [name]  Procedure(s) Performed: APPENDECTOMY LAPAROSCOPIC (N/A )  Patient location during evaluation: PACU Anesthesia Type: General Level of consciousness: awake and alert Pain management: pain level controlled Vital Signs Assessment: post-procedure vital signs reviewed and stable Respiratory status: spontaneous breathing, nonlabored ventilation, respiratory function stable and patient connected to nasal cannula oxygen Cardiovascular status: blood pressure returned to baseline and stable Postop Assessment: no apparent nausea or vomiting Anesthetic complications: no     Last Vitals:  Vitals:   08/16/17 0232 08/16/17 0406  BP: (!) 93/52 (!) 100/53  Pulse: 73 73  Resp: 16 18  Temp: 36.8 C 36.7 C  SpO2: 95% 97%    Last Pain:  Vitals:   08/16/17 0406  TempSrc: Oral  PainSc:                  Precious Haws Piscitello

## 2017-08-16 NOTE — Anesthesia Post-op Follow-up Note (Addendum)
Anesthesia QCDR form completed.        

## 2017-08-16 NOTE — Transfer of Care (Signed)
Immediate Anesthesia Transfer of Care Note  Patient: Judy Salinas  Procedure(s) Performed: APPENDECTOMY LAPAROSCOPIC (N/A )  Patient Location: PACU  Anesthesia Type:General  Level of Consciousness: sedated  Airway & Oxygen Therapy: Patient Spontanous Breathing and Patient connected to face mask oxygen  Post-op Assessment: Report given to RN and Post -op Vital signs reviewed and stable  Post vital signs: Reviewed and stable  Last Vitals:  Vitals:   08/15/17 2000 08/15/17 2130  BP: 119/71 126/72  Pulse: (!) 112 (!) 112  Resp:  (!) 25  Temp:    SpO2: 95% 94%    Last Pain:  Vitals:   08/15/17 2130  TempSrc:   PainSc: 9          Complications: No apparent anesthesia complications

## 2017-08-16 NOTE — Plan of Care (Signed)
  Skin Integrity: Demonstration of wound healing without infection will improve 08/16/2017 0407 - Progressing by Marylouise Stacks, RN 08/16/2017 0406 - Progressing by Marylouise Stacks, RN   Clinical Measurements: Postoperative complications will be avoided or minimized 08/16/2017 0407 - Progressing by Marylouise Stacks, RN 08/16/2017 0406 - Progressing by Marylouise Stacks, RN

## 2017-08-17 ENCOUNTER — Encounter: Payer: Self-pay | Admitting: Surgery

## 2017-08-20 LAB — SURGICAL PATHOLOGY

## 2017-08-26 ENCOUNTER — Other Ambulatory Visit: Payer: Self-pay

## 2017-08-28 ENCOUNTER — Ambulatory Visit (INDEPENDENT_AMBULATORY_CARE_PROVIDER_SITE_OTHER): Payer: Medicare Other | Admitting: Surgery

## 2017-08-28 ENCOUNTER — Encounter: Payer: Self-pay | Admitting: Surgery

## 2017-08-28 DIAGNOSIS — Z09 Encounter for follow-up examination after completed treatment for conditions other than malignant neoplasm: Secondary | ICD-10-CM

## 2017-08-28 NOTE — Progress Notes (Signed)
08/28/2017  HPI: Patient is s/p laparoscopic appendectomy on 12/29.  She presents for follow up.  Denies any issues.  Reports eating well with normal bowel movements and no significant pain issues.  Had a low grade temp of 98.7 at home but otherwise no actual fevers.  Vital signs: There were no vitals taken for this visit.   Physical Exam: Constitutional: No acute distress Abdomen: soft, nondistended, nontender to palpation.  Incisions are clean, dry, intact with no evidence of infection.  Assessment/Plan: 62 yo female s/p laparoscopic appendectomy  --reviewed pathology with patient.  Acute appendicitis without malignancy --patient has no heavy lifting or pushing restriction until 1/25.  She may resume normal activities at that point. --may submerge wounds in pool/tub. --follow up prn.   Melvyn Neth, [place]

## 2017-08-28 NOTE — Patient Instructions (Signed)

## 2017-10-27 DIAGNOSIS — E041 Nontoxic single thyroid nodule: Secondary | ICD-10-CM | POA: Insufficient documentation

## 2017-10-27 DIAGNOSIS — M797 Fibromyalgia: Secondary | ICD-10-CM | POA: Insufficient documentation

## 2017-10-27 DIAGNOSIS — Z720 Tobacco use: Secondary | ICD-10-CM | POA: Insufficient documentation

## 2017-10-27 DIAGNOSIS — K588 Other irritable bowel syndrome: Secondary | ICD-10-CM | POA: Insufficient documentation

## 2017-10-27 DIAGNOSIS — E039 Hypothyroidism, unspecified: Secondary | ICD-10-CM | POA: Insufficient documentation

## 2017-10-27 DIAGNOSIS — E785 Hyperlipidemia, unspecified: Secondary | ICD-10-CM | POA: Insufficient documentation

## 2017-10-27 DIAGNOSIS — G4700 Insomnia, unspecified: Secondary | ICD-10-CM | POA: Insufficient documentation

## 2017-11-12 ENCOUNTER — Encounter: Payer: Self-pay | Admitting: Student in an Organized Health Care Education/Training Program

## 2017-11-12 ENCOUNTER — Ambulatory Visit
Payer: Medicare Other | Attending: Student in an Organized Health Care Education/Training Program | Admitting: Student in an Organized Health Care Education/Training Program

## 2017-11-12 ENCOUNTER — Other Ambulatory Visit: Payer: Self-pay

## 2017-11-12 VITALS — BP 126/89 | HR 96 | Temp 98.1°F | Resp 20 | Ht 66.0 in | Wt 143.0 lb

## 2017-11-12 DIAGNOSIS — M25511 Pain in right shoulder: Secondary | ICD-10-CM

## 2017-11-12 DIAGNOSIS — M25562 Pain in left knee: Secondary | ICD-10-CM | POA: Diagnosis not present

## 2017-11-12 DIAGNOSIS — K589 Irritable bowel syndrome without diarrhea: Secondary | ICD-10-CM | POA: Diagnosis not present

## 2017-11-12 DIAGNOSIS — F17210 Nicotine dependence, cigarettes, uncomplicated: Secondary | ICD-10-CM | POA: Insufficient documentation

## 2017-11-12 DIAGNOSIS — Z880 Allergy status to penicillin: Secondary | ICD-10-CM | POA: Insufficient documentation

## 2017-11-12 DIAGNOSIS — M545 Low back pain, unspecified: Secondary | ICD-10-CM | POA: Insufficient documentation

## 2017-11-12 DIAGNOSIS — M25561 Pain in right knee: Secondary | ICD-10-CM

## 2017-11-12 DIAGNOSIS — Z885 Allergy status to narcotic agent status: Secondary | ICD-10-CM | POA: Insufficient documentation

## 2017-11-12 DIAGNOSIS — Z882 Allergy status to sulfonamides status: Secondary | ICD-10-CM | POA: Diagnosis not present

## 2017-11-12 DIAGNOSIS — G894 Chronic pain syndrome: Secondary | ICD-10-CM | POA: Diagnosis present

## 2017-11-12 DIAGNOSIS — Z79899 Other long term (current) drug therapy: Secondary | ICD-10-CM | POA: Insufficient documentation

## 2017-11-12 DIAGNOSIS — Z886 Allergy status to analgesic agent status: Secondary | ICD-10-CM | POA: Insufficient documentation

## 2017-11-12 DIAGNOSIS — K219 Gastro-esophageal reflux disease without esophagitis: Secondary | ICD-10-CM | POA: Diagnosis not present

## 2017-11-12 DIAGNOSIS — M797 Fibromyalgia: Secondary | ICD-10-CM

## 2017-11-12 DIAGNOSIS — G8929 Other chronic pain: Secondary | ICD-10-CM | POA: Diagnosis not present

## 2017-11-12 DIAGNOSIS — Z888 Allergy status to other drugs, medicaments and biological substances status: Secondary | ICD-10-CM | POA: Diagnosis not present

## 2017-11-12 NOTE — Progress Notes (Signed)
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 years.  She has had multiple steroid injections in her spine and her hips which were not effective.  She is not interested in repeating any injections.  She states that she is here primarily for medication management.  Patient must discontinue Xanax and avoid all benzodiazepines if she is to be considered for opioid therapy.  We have not taken this patient on for chronic opioid therapy yet and will not do so until our evaluation is complete which could take weeks-months I will obtain a urine drug screen today.  I will also obtain a repeat urine drug screen at the next visit so long as she has not had any benzodiazepine fills from the pharmacy.  If these UDS is okay, patient will be sent to pain psychology for evaluation of substance abuse disorder.  Pending their evaluation, I may take the patient on for chronic opioid therapy however we will try and wean her current dose.  Plan of Care (Initial workup plan)  Note: Please be advised that as per  protocol, today's visit has been an evaluation only. We have not taken over the patient's controlled substance management.  Ordered Lab-work, Procedure(s), Referral(s), & Consult(s): Orders Placed This Encounter  Procedures  . Compliance Drug Analysis, Ur   Provider-requested follow-up: Return in about 1 month (around 12/10/2017).  Future Appointments  Date Time Provider [place]  12/08/2017 12:15 PM Gillis Santa, MD [facility] None    Primary Care Physician: Baxter Hire, MD Location: [facility] Note by: Gillis Santa, M.D, Date: 11/12/2017; Time: 2:59 PM  Patient Instructions  Must stop Benzodiazepine (Xanax) to be considered for chronic opioid therapy.  No more prescription refills for Xanax  We will get UDS today and repeat UDS at next visit before we consider taking you on as a patient

## 2017-11-12 NOTE — Progress Notes (Signed)
Safety precautions to be maintained throughout the outpatient stay will include: orient to surroundings, keep bed in low position, maintain call bell within reach at all times, provide assistance with transfer out of bed and ambulation.  

## 2017-11-12 NOTE — Patient Instructions (Signed)
Must stop Benzodiazepine (Xanax) to be considered for chronic opioid therapy.  No more prescription refills for Xanax  We will get UDS today and repeat UDS at next visit before we consider taking you on as a patient

## 2017-11-17 LAB — COMPLIANCE DRUG ANALYSIS, UR

## 2017-12-08 ENCOUNTER — Ambulatory Visit: Payer: Medicare Other | Admitting: Student in an Organized Health Care Education/Training Program

## 2017-12-12 LAB — HIV ANTIBODY (ROUTINE TESTING W REFLEX): HIV SCREEN 4TH GENERATION: NONREACTIVE

## 2018-05-07 ENCOUNTER — Other Ambulatory Visit: Payer: Self-pay | Admitting: Orthopedic Surgery

## 2018-05-07 DIAGNOSIS — M25511 Pain in right shoulder: Principal | ICD-10-CM

## 2018-05-07 DIAGNOSIS — G8929 Other chronic pain: Secondary | ICD-10-CM

## 2018-05-21 ENCOUNTER — Ambulatory Visit
Admission: RE | Admit: 2018-05-21 | Discharge: 2018-05-21 | Disposition: A | Discharge status: Home / Self Care | Payer: Medicare Other | Source: Ambulatory Visit | Attending: Orthopedic Surgery | Admitting: Orthopedic Surgery

## 2018-05-21 ENCOUNTER — Encounter (INDEPENDENT_AMBULATORY_CARE_PROVIDER_SITE_OTHER): Payer: Self-pay

## 2018-05-21 DIAGNOSIS — G8929 Other chronic pain: Secondary | ICD-10-CM | POA: Diagnosis present

## 2018-05-21 DIAGNOSIS — M75121 Complete rotator cuff tear or rupture of right shoulder, not specified as traumatic: Secondary | ICD-10-CM | POA: Insufficient documentation

## 2018-05-21 DIAGNOSIS — M25511 Pain in right shoulder: Secondary | ICD-10-CM | POA: Diagnosis present

## 2018-05-21 DIAGNOSIS — M5412 Radiculopathy, cervical region: Secondary | ICD-10-CM | POA: Diagnosis present

## 2018-05-21 DIAGNOSIS — M19011 Primary osteoarthritis, right shoulder: Secondary | ICD-10-CM | POA: Diagnosis not present

## 2018-06-04 ENCOUNTER — Encounter
Admission: RE | Admit: 2018-06-04 | Discharge: 2018-06-04 | Disposition: A | Discharge status: Home / Self Care | Payer: Medicare Other | Source: Ambulatory Visit | Attending: Orthopedic Surgery | Admitting: Orthopedic Surgery

## 2018-06-04 ENCOUNTER — Other Ambulatory Visit: Payer: Self-pay

## 2018-06-04 DIAGNOSIS — Z01818 Encounter for other preprocedural examination: Secondary | ICD-10-CM | POA: Insufficient documentation

## 2018-06-04 HISTORY — DX: Other recurrent depressive disorders: F33.8

## 2018-06-04 HISTORY — DX: Anxiety disorder, unspecified: F41.9

## 2018-06-04 HISTORY — DX: Hypothyroidism, unspecified: E03.9

## 2018-06-04 NOTE — Pre-Procedure Instructions (Signed)
Labs noted 04/22/18 in [place]

## 2018-06-04 NOTE — Patient Instructions (Signed)
Your procedure is scheduled on: Mon 06/08/18 Report to [place] ON 2ND FLOOR MEDICAL MALL ENTRANCE. To find out your arrival time please call [phone] between 1PM - 3PM on Fri 06/05/18.  Remember: Instructions that are not followed completely may result in serious medical risk, up to and including death, or upon the discretion of your surgeon and anesthesiologist your surgery may need to be rescheduled.     _X__ 1. Do not eat food after midnight the night before your procedure.                 No gum chewing or hard candies. You may drink clear liquids up to 2 hours                 before you are scheduled to arrive for your surgery- DO not drink clear                 liquids within 2 hours of the start of your surgery.                 Clear Liquids include:  water, apple juice without pulp, clear carbohydrate                 drink such as Clearfast or Gatorade, Black Coffee or Tea (Do not add                 anything to coffee or tea).  __X__2.  On the morning of surgery brush your teeth with toothpaste and water, you                 may rinse your mouth with mouthwash if you wish.  Do not swallow any              toothpaste of mouthwash.     _X__ 3.  No Alcohol for 24 hours before or after surgery.   _X__ 4.  Do Not Smoke or use e-cigarettes For 24 Hours Prior to Your Surgery.                 Do not use any chewable tobacco products for at least 6 hours prior to                 surgery.  ____  5.  Bring all medications with you on the day of surgery if instructed.   __X__  6.  Notify your doctor if there is any change in your medical condition      (cold, fever, infections).     Do not wear jewelry, make-up, hairpins, clips or nail polish. Do not wear lotions, powders, or perfumes.  Do not shave 48 hours prior to surgery. Men may shave face and neck. Do not bring valuables to the hospital.    [facility] is not responsible for any belongings or  valuables.  Contacts, dentures/partials or body piercings may not be worn into surgery. Bring a case for your contacts, glasses or hearing aids, a denture cup will be supplied. Leave your suitcase in the car. After surgery it may be brought to your room. For patients admitted to the hospital, discharge time is determined by your treatment team.   Patients discharged the day of surgery will not be allowed to drive home.   Please read over the following fact sheets that you were given:   MRSA Information  __X__ Take these medicines the morning of surgery with A SIP OF WATER:  1. esomeprazole (NEXIUM)  2. Linaclotide (LINZESS)  3. SYNTHROID   4. traMADol (ULTRAM) if needed  5. ALPRAZolam Duanne Moron) if needed  6.  ____ Fleet Enema (as directed)   __X__ Use CHG Soap/SAGE wipes as directed  ____ Use inhalers on the day of surgery  ____ Stop metformin/Janumet/Farxiga 2 days prior to surgery    ____ Take 1/2 of usual insulin dose the night before surgery. No insulin the morning          of surgery.   ____ Stop Blood Thinners Coumadin/Plavix/Xarelto/Pleta/Pradaxa/Eliquis/Effient/Aspirin  on   Or contact your Surgeon, Cardiologist or Medical Doctor regarding  ability to stop your blood thinners  __X__ Stop Anti-inflammatories 7 days before surgery such as Advil, Ibuprofen, Motrin,  BC or Goodies Powder, Naprosyn, Naproxen, Aleve, Aspirin, Meloxicam    __X__ Stop all herbal supplements, fish oil or vitamin E until after surgery.  B12, magnesium and potassium ok to continue up until day of surgery  ____ Bring C-Pap to the hospital.

## 2018-06-07 MED ORDER — CLINDAMYCIN PHOSPHATE 900 MG/50ML IV SOLN
900.0000 mg | Freq: Once | INTRAVENOUS | Status: AC
  Administered 2018-06-08: 600 mg via INTRAVENOUS

## 2018-06-08 ENCOUNTER — Encounter: Payer: Self-pay | Admitting: Emergency Medicine

## 2018-06-08 ENCOUNTER — Ambulatory Visit: Payer: Medicare Other | Admitting: Anesthesiology

## 2018-06-08 ENCOUNTER — Other Ambulatory Visit: Payer: Self-pay

## 2018-06-08 ENCOUNTER — Ambulatory Visit
Admission: RE | Admit: 2018-06-08 | Discharge: 2018-06-08 | Disposition: A | Discharge status: Home / Self Care | Payer: Medicare Other | Source: Ambulatory Visit | Attending: Orthopedic Surgery | Admitting: Orthopedic Surgery

## 2018-06-08 ENCOUNTER — Encounter
Admission: RE | Disposition: A | Discharge status: Home / Self Care | Payer: Self-pay | Source: Ambulatory Visit | Attending: Orthopedic Surgery

## 2018-06-08 DIAGNOSIS — S46111A Strain of muscle, fascia and tendon of long head of biceps, right arm, initial encounter: Secondary | ICD-10-CM | POA: Diagnosis not present

## 2018-06-08 DIAGNOSIS — Z79899 Other long term (current) drug therapy: Secondary | ICD-10-CM | POA: Diagnosis not present

## 2018-06-08 DIAGNOSIS — M797 Fibromyalgia: Secondary | ICD-10-CM | POA: Diagnosis not present

## 2018-06-08 DIAGNOSIS — F17210 Nicotine dependence, cigarettes, uncomplicated: Secondary | ICD-10-CM | POA: Diagnosis not present

## 2018-06-08 DIAGNOSIS — M75101 Unspecified rotator cuff tear or rupture of right shoulder, not specified as traumatic: Secondary | ICD-10-CM | POA: Diagnosis present

## 2018-06-08 DIAGNOSIS — X58XXXA Exposure to other specified factors, initial encounter: Secondary | ICD-10-CM | POA: Diagnosis not present

## 2018-06-08 DIAGNOSIS — K219 Gastro-esophageal reflux disease without esophagitis: Secondary | ICD-10-CM | POA: Diagnosis not present

## 2018-06-08 DIAGNOSIS — Y929 Unspecified place or not applicable: Secondary | ICD-10-CM | POA: Insufficient documentation

## 2018-06-08 DIAGNOSIS — F419 Anxiety disorder, unspecified: Secondary | ICD-10-CM | POA: Diagnosis not present

## 2018-06-08 HISTORY — PX: SHOULDER ARTHROSCOPY WITH OPEN ROTATOR CUFF REPAIR: SHX6092

## 2018-06-08 LAB — URINE DRUG SCREEN, QUALITATIVE (ARMC ONLY)
AMPHETAMINES, UR SCREEN: NOT DETECTED
Barbiturates, Ur Screen: NOT DETECTED
Benzodiazepine, Ur Scrn: POSITIVE — AB
COCAINE METABOLITE, UR ~~LOC~~: NOT DETECTED
Cannabinoid 50 Ng, Ur ~~LOC~~: NOT DETECTED
MDMA (ECSTASY) UR SCREEN: NOT DETECTED
Methadone Scn, Ur: NOT DETECTED
Opiate, Ur Screen: NOT DETECTED
Phencyclidine (PCP) Ur S: NOT DETECTED
TRICYCLIC, UR SCREEN: POSITIVE — AB

## 2018-06-08 SURGERY — ARTHROSCOPY, SHOULDER WITH REPAIR, ROTATOR CUFF, OPEN
Anesthesia: General | Site: Shoulder | Laterality: Right

## 2018-06-08 MED ORDER — ONDANSETRON 4 MG PO TBDP: 4.0000 mg | ORAL_TABLET | Freq: Three times a day (TID) | ORAL | 0 refills | Status: DC | PRN

## 2018-06-08 MED ORDER — DEXAMETHASONE SODIUM PHOSPHATE 10 MG/ML IJ SOLN
INTRAMUSCULAR | Status: AC
  Filled 2018-06-08: qty 1

## 2018-06-08 MED ORDER — ASPIRIN EC 325 MG PO TBEC: 325.0000 mg | DELAYED_RELEASE_TABLET | Freq: Every day | ORAL | 0 refills | Status: AC

## 2018-06-08 MED ORDER — MIDAZOLAM HCL 2 MG/2ML IJ SOLN
1.0000 mg | Freq: Once | INTRAMUSCULAR | Status: AC
  Administered 2018-06-08: 1 mg via INTRAVENOUS

## 2018-06-08 MED ORDER — GLYCOPYRROLATE 0.2 MG/ML IJ SOLN
INTRAMUSCULAR | Status: DC | PRN
  Administered 2018-06-08: 0.6 mg via INTRAVENOUS

## 2018-06-08 MED ORDER — ROCURONIUM BROMIDE 100 MG/10ML IV SOLN
INTRAVENOUS | Status: DC | PRN
  Administered 2018-06-08: 50 mg via INTRAVENOUS
  Administered 2018-06-08: 10 mg via INTRAVENOUS
  Administered 2018-06-08: 20 mg via INTRAVENOUS

## 2018-06-08 MED ORDER — PROPOFOL 10 MG/ML IV BOLUS
INTRAVENOUS | Status: DC | PRN
  Administered 2018-06-08: 120 mg via INTRAVENOUS

## 2018-06-08 MED ORDER — MEPERIDINE HCL 50 MG/ML IJ SOLN: 6.2500 mg | INTRAMUSCULAR | Status: DC | PRN

## 2018-06-08 MED ORDER — PROPOFOL 10 MG/ML IV BOLUS
INTRAVENOUS | Status: AC
  Filled 2018-06-08: qty 20

## 2018-06-08 MED ORDER — ROCURONIUM BROMIDE 50 MG/5ML IV SOLN
INTRAVENOUS | Status: AC
  Filled 2018-06-08: qty 1

## 2018-06-08 MED ORDER — NEOSTIGMINE METHYLSULFATE 10 MG/10ML IV SOLN
INTRAVENOUS | Status: DC | PRN
  Administered 2018-06-08: 3 mg via INTRAVENOUS

## 2018-06-08 MED ORDER — BUPIVACAINE LIPOSOME 1.3 % IJ SUSP
INTRAMUSCULAR | Status: DC | PRN
  Administered 2018-06-08: 20 mL via PERINEURAL

## 2018-06-08 MED ORDER — BUPIVACAINE HCL (PF) 0.5 % IJ SOLN
INTRAMUSCULAR | Status: DC | PRN
  Administered 2018-06-08: 10 mL via PERINEURAL

## 2018-06-08 MED ORDER — PROMETHAZINE HCL 25 MG/ML IJ SOLN: 6.2500 mg | INTRAMUSCULAR | Status: DC | PRN

## 2018-06-08 MED ORDER — EPHEDRINE SULFATE 50 MG/ML IJ SOLN
INTRAMUSCULAR | Status: DC | PRN
  Administered 2018-06-08 (×2): 5 mg via INTRAVENOUS

## 2018-06-08 MED ORDER — GLYCOPYRROLATE 0.2 MG/ML IJ SOLN
INTRAMUSCULAR | Status: AC
  Filled 2018-06-08: qty 1

## 2018-06-08 MED ORDER — LIDOCAINE HCL (PF) 2 % IJ SOLN
INTRAMUSCULAR | Status: AC
  Filled 2018-06-08: qty 10

## 2018-06-08 MED ORDER — MIDAZOLAM HCL 2 MG/2ML IJ SOLN
INTRAMUSCULAR | Status: AC
  Administered 2018-06-08: 1 mg via INTRAVENOUS
  Filled 2018-06-08: qty 2

## 2018-06-08 MED ORDER — LIDOCAINE HCL (PF) 1 % IJ SOLN
INTRAMUSCULAR | Status: AC
  Filled 2018-06-08: qty 5

## 2018-06-08 MED ORDER — FENTANYL CITRATE (PF) 100 MCG/2ML IJ SOLN
INTRAMUSCULAR | Status: DC | PRN
  Administered 2018-06-08 (×2): 25 ug via INTRAVENOUS
  Administered 2018-06-08: 50 ug via INTRAVENOUS

## 2018-06-08 MED ORDER — FENTANYL CITRATE (PF) 100 MCG/2ML IJ SOLN
INTRAMUSCULAR | Status: AC
  Administered 2018-06-08: 50 ug via INTRAVENOUS
  Filled 2018-06-08: qty 2

## 2018-06-08 MED ORDER — ACETAMINOPHEN 500 MG PO TABS: 1000.0000 mg | ORAL_TABLET | Freq: Three times a day (TID) | ORAL | 2 refills | Status: AC

## 2018-06-08 MED ORDER — NEOSTIGMINE METHYLSULFATE 10 MG/10ML IV SOLN
INTRAVENOUS | Status: AC
  Filled 2018-06-08: qty 1

## 2018-06-08 MED ORDER — LIDOCAINE HCL (PF) 1 % IJ SOLN
INTRAMUSCULAR | Status: DC | PRN
  Administered 2018-06-08: 3 mL

## 2018-06-08 MED ORDER — DEXAMETHASONE SODIUM PHOSPHATE 10 MG/ML IJ SOLN
INTRAMUSCULAR | Status: DC | PRN
  Administered 2018-06-08: 8 mg via INTRAVENOUS

## 2018-06-08 MED ORDER — LACTATED RINGERS IV SOLN
INTRAVENOUS | Status: DC | PRN
  Administered 2018-06-08: 8 mL

## 2018-06-08 MED ORDER — CLINDAMYCIN PHOSPHATE 900 MG/50ML IV SOLN
INTRAVENOUS | Status: AC
  Filled 2018-06-08: qty 50

## 2018-06-08 MED ORDER — BUPIVACAINE HCL (PF) 0.5 % IJ SOLN
INTRAMUSCULAR | Status: AC
  Filled 2018-06-08: qty 10

## 2018-06-08 MED ORDER — FENTANYL CITRATE (PF) 100 MCG/2ML IJ SOLN
INTRAMUSCULAR | Status: AC
  Filled 2018-06-08: qty 2

## 2018-06-08 MED ORDER — BUPIVACAINE LIPOSOME 1.3 % IJ SUSP
INTRAMUSCULAR | Status: AC
  Filled 2018-06-08: qty 20

## 2018-06-08 MED ORDER — FENTANYL CITRATE (PF) 100 MCG/2ML IJ SOLN: 25.0000 ug | INTRAMUSCULAR | Status: DC | PRN

## 2018-06-08 MED ORDER — MIDAZOLAM HCL 2 MG/2ML IJ SOLN
INTRAMUSCULAR | Status: AC
  Filled 2018-06-08: qty 2

## 2018-06-08 MED ORDER — MIDAZOLAM HCL 2 MG/2ML IJ SOLN
INTRAMUSCULAR | Status: DC | PRN
  Administered 2018-06-08: 1 mg via INTRAVENOUS

## 2018-06-08 MED ORDER — PHENYLEPHRINE HCL 10 MG/ML IJ SOLN
INTRAMUSCULAR | Status: DC | PRN
  Administered 2018-06-08: 80 ug via INTRAVENOUS

## 2018-06-08 MED ORDER — LACTATED RINGERS IV SOLN
INTRAVENOUS | Status: DC
  Administered 2018-06-08 (×4): via INTRAVENOUS

## 2018-06-08 MED ORDER — OXYCODONE HCL 5 MG PO TABS: 5.0000 mg | ORAL_TABLET | ORAL | 0 refills | Status: AC | PRN

## 2018-06-08 MED ORDER — FENTANYL CITRATE (PF) 100 MCG/2ML IJ SOLN
50.0000 ug | Freq: Once | INTRAMUSCULAR | Status: AC
  Administered 2018-06-08: 50 ug via INTRAVENOUS

## 2018-06-08 MED ORDER — LIDOCAINE HCL (CARDIAC) PF 100 MG/5ML IV SOSY
PREFILLED_SYRINGE | INTRAVENOUS | Status: DC | PRN
  Administered 2018-06-08: 60 mg via INTRAVENOUS

## 2018-06-08 SURGICAL SUPPLY — 93 items
ADAPTER IRRIG TUBE 2 SPIKE SOL (ADAPTER) ×6 IMPLANT
ANCHOR ICONIX SPEED 2.0 TAPE (Anchor) ×6 IMPLANT
ANCHOR SUT BIO SW 4.75X19.1 (Anchor) ×6 IMPLANT
ANCHOR SUT FBRTK SUTURETAP 1.3 (Anchor) ×3 IMPLANT
BIT DRILL RIGD1.8MM FBRTK STRL (DRILL) ×1 IMPLANT
BLADE OSCILLATING/SAGITTAL (BLADE)
BLADE SW THK.38XMED LNG THN (BLADE) IMPLANT
BUR BR 5.5 12 FLUTE (BURR) IMPLANT
BUR RADIUS 4.0X18.5 (BURR) ×3 IMPLANT
CANNULA 5.75X7CM (CANNULA) ×1
CANNULA PART THRD DISP 5.75X7 (CANNULA) ×2 IMPLANT
CANNULA PARTIAL THREAD 2X7 (CANNULA) ×3 IMPLANT
CANNULA TWIST IN 8.25X9CM (CANNULA) IMPLANT
CHLORAPREP W/TINT 26ML (MISCELLANEOUS) ×3 IMPLANT
CLOSURE WOUND 1/2 X4 (GAUZE/BANDAGES/DRESSINGS)
COOLER POLAR GLACIER W/PUMP (MISCELLANEOUS) ×3 IMPLANT
COVER LIGHT HANDLE STERIS (MISCELLANEOUS) IMPLANT
COVER WAND RF STERILE (DRAPES) ×3 IMPLANT
CRADLE LAMINECT ARM (MISCELLANEOUS) ×6 IMPLANT
DERMABOND ADVANCED (GAUZE/BANDAGES/DRESSINGS)
DERMABOND ADVANCED .7 DNX12 (GAUZE/BANDAGES/DRESSINGS) IMPLANT
DEVICE SUCT BLK HOLE OR FLOOR (MISCELLANEOUS) ×6 IMPLANT
DRAPE IMP U-DRAPE 54X76 (DRAPES) ×6 IMPLANT
DRAPE INCISE IOBAN 66X45 STRL (DRAPES) ×3 IMPLANT
DRAPE SHEET LG 3/4 BI-LAMINATE (DRAPES) ×3 IMPLANT
DRAPE STERI 35X30 U-POUCH (DRAPES) ×3 IMPLANT
DRAPE U-SHAPE 47X51 STRL (DRAPES) ×3 IMPLANT
DRILL RIGID 1.8MM FBRTK STRL (DRILL) ×3
DRSG TEGADERM 4X4.75 (GAUZE/BANDAGES/DRESSINGS) ×9 IMPLANT
ELECT REM PT RETURN 9FT ADLT (ELECTROSURGICAL) ×3
ELECTRODE REM PT RTRN 9FT ADLT (ELECTROSURGICAL) ×1 IMPLANT
GAUZE PETRO XEROFOAM 1X8 (MISCELLANEOUS) ×3 IMPLANT
GAUZE SPONGE 4X4 12PLY STRL (GAUZE/BANDAGES/DRESSINGS) ×3 IMPLANT
GLOVE BIO SURGEON STRL SZ7.5 (GLOVE) ×9 IMPLANT
GLOVE BIOGEL PI IND STRL 7.5 (GLOVE) ×3 IMPLANT
GLOVE BIOGEL PI IND STRL 8 (GLOVE) ×1 IMPLANT
GLOVE BIOGEL PI INDICATOR 7.5 (GLOVE) ×6
GLOVE BIOGEL PI INDICATOR 8 (GLOVE) ×2
GLOVE SURG SYN 8.0 (GLOVE) ×3 IMPLANT
GOWN STRL REUS W/ TWL LRG LVL3 (GOWN DISPOSABLE) ×1 IMPLANT
GOWN STRL REUS W/TWL LRG LVL3 (GOWN DISPOSABLE) ×2
GOWN STRL REUS W/TWL LRG LVL4 (GOWN DISPOSABLE) ×12 IMPLANT
IMP SYSTEM BRIDGE 4.75X19.1 (Anchor) ×3 IMPLANT
IMPL SYSTEM BRIDGE 4.75X19.1 (Anchor) ×1 IMPLANT
IV LACTATED RINGER IRRG 3000ML (IV SOLUTION) ×16
IV LR IRRIG 3000ML ARTHROMATIC (IV SOLUTION) ×8 IMPLANT
KIT SPEAR STR 1.6MM DRILL (MISCELLANEOUS) ×3 IMPLANT
KIT STABILIZATION SHOULDER (MISCELLANEOUS) ×3 IMPLANT
KIT TURNOVER KIT A (KITS) ×3 IMPLANT
MANIFOLD NEPTUNE II (INSTRUMENTS) ×3 IMPLANT
MASK FACE SPIDER DISP (MASK) ×3 IMPLANT
MAT ABSORB  FLUID 56X50 GRAY (MISCELLANEOUS) ×4
MAT ABSORB FLUID 56X50 GRAY (MISCELLANEOUS) ×2 IMPLANT
NDL MAYO CATGUT SZ5 (NEEDLE) ×2
NDL SAFETY ECLIPSE 18X1.5 (NEEDLE) ×1 IMPLANT
NDL SUT 5 .5 CRC TROC PNT MAYO (NEEDLE) ×1 IMPLANT
NEEDLE HYPO 18GX1.5 SHARP (NEEDLE) ×2
NEEDLE HYPO 22GX1.5 SAFETY (NEEDLE) ×3 IMPLANT
NEEDLE MAYO 6 CRC TAPER PT (NEEDLE) IMPLANT
NEEDLE SCORPION MULTI FIRE (NEEDLE) ×3 IMPLANT
NS IRRIG 500ML POUR BTL (IV SOLUTION) ×3 IMPLANT
PACK ARTHROSCOPY SHOULDER (MISCELLANEOUS) ×3 IMPLANT
PAD ABD DERMACEA PRESS 5X9 (GAUZE/BANDAGES/DRESSINGS) IMPLANT
PAD WRAPON POLAR SHDR XLG (MISCELLANEOUS) ×1 IMPLANT
SET TUBE SUCT SHAVER OUTFL 24K (TUBING) ×3 IMPLANT
SET TUBE TIP INTRA-ARTICULAR (MISCELLANEOUS) ×3 IMPLANT
SLING ULTRA II M (MISCELLANEOUS) ×3 IMPLANT
STAPLER SKIN PROX 35W (STAPLE) IMPLANT
STRAP SAFETY 5IN WIDE (MISCELLANEOUS) ×3 IMPLANT
STRIP CLOSURE SKIN 1/2X4 (GAUZE/BANDAGES/DRESSINGS) IMPLANT
SUT ETHILON 3-0 (SUTURE) ×3 IMPLANT
SUT ETHILON 4-0 (SUTURE) ×2
SUT ETHILON 4-0 FS2 18XMFL BLK (SUTURE) ×1
SUT FIBERWIRE #2 38 T-5 BLUE (SUTURE) ×3
SUT LASSO 90 DEG SD STR (SUTURE) IMPLANT
SUT MNCRL 4-0 (SUTURE) ×2
SUT MNCRL 4-0 27XMFL (SUTURE) ×1
SUT PROLENE 0 CT 2 (SUTURE) IMPLANT
SUT TICRON 2-0 30IN 311381 (SUTURE) IMPLANT
SUT VIC AB 0 CT1 36 (SUTURE) ×3 IMPLANT
SUT VIC AB 2-0 CT2 27 (SUTURE) ×3 IMPLANT
SUT VICRYL 3-0 27IN (SUTURE) ×3 IMPLANT
SUTURE ETHLN 4-0 FS2 18XMF BLK (SUTURE) ×1 IMPLANT
SUTURE FIBERWR #2 38 T-5 BLUE (SUTURE) ×1 IMPLANT
SUTURE MNCRL 4-0 27XMF (SUTURE) ×1 IMPLANT
SYR 10ML LL (SYRINGE) ×3 IMPLANT
TAPE CLOTH 3X10 WHT NS LF (GAUZE/BANDAGES/DRESSINGS) ×3 IMPLANT
TAPE MICROFOAM 4IN (TAPE) ×3 IMPLANT
TUBING ARTHRO INFLOW-ONLY STRL (TUBING) ×3 IMPLANT
TUBING CONNECTING 10 (TUBING) ×6 IMPLANT
TUBING CONNECTING 10' (TUBING) ×3
WAND HAND CNTRL MULTIVAC 90 (MISCELLANEOUS) ×6 IMPLANT
WRAPON POLAR PAD SHDR XLG (MISCELLANEOUS) ×3

## 2018-06-08 NOTE — Anesthesia Procedure Notes (Signed)
Procedure Name: Intubation Date/Time: 06/08/2018 7:45 AM Performed by: Allean Found, CRNA Pre-anesthesia Checklist: Patient identified, Patient being monitored, Timeout performed, Emergency Drugs available and Suction available Patient Re-evaluated:Patient Re-evaluated prior to induction Oxygen Delivery Method: Circle system utilized Preoxygenation: Pre-oxygenation with 100% oxygen Induction Type: IV induction Ventilation: Mask ventilation without difficulty Laryngoscope Size: Mac and 3 Grade View: Grade I Tube type: Oral Tube size: 7.0 mm Number of attempts: 1 Airway Equipment and Method: Stylet Placement Confirmation: ETT inserted through vocal cords under direct vision,  positive ETCO2 and breath sounds checked- equal and bilateral Secured at: 21 cm Tube secured with: Tape Dental Injury: Teeth and Oropharynx as per pre-operative assessment

## 2018-06-08 NOTE — Anesthesia Postprocedure Evaluation (Signed)
Anesthesia Post Note  Patient: Judy Salinas  Procedure(s) Performed: SHOULDER ARTHROSCOPY WITH MINI OPEN ROTATOR CUFF REPAIR,BICEPS TENODESIS,SUBSCAPULARIS REPAIR,SUBCROMINAL DECOMPRESSION (Right Shoulder)  Patient location during evaluation: PACU Anesthesia Type: General Level of consciousness: awake and alert and oriented Pain management: pain level controlled Vital Signs Assessment: post-procedure vital signs reviewed and stable Respiratory status: spontaneous breathing, nonlabored ventilation and respiratory function stable Cardiovascular status: blood pressure returned to baseline and stable Postop Assessment: no signs of nausea or vomiting Anesthetic complications: no     Last Vitals:  Vitals:   06/08/18 1214 06/08/18 1256  BP: 129/66 119/63  Pulse: 81 88  Resp: 16 16  Temp: (!) 36.3 C   SpO2: 98% 98%    Last Pain:  Vitals:   06/08/18 1256  TempSrc:   PainSc: 0-No pain                 Daqwan Dougal

## 2018-06-08 NOTE — Transfer of Care (Signed)
Immediate Anesthesia Transfer of Care Note  Patient: Judy Salinas  Procedure(s) Performed: SHOULDER ARTHROSCOPY WITH MINI OPEN ROTATOR CUFF REPAIR,BICEPS TENODESIS,SUBSCAPULARIS REPAIR,SUBCROMINAL DECOMPRESSION (Right Shoulder)  Patient Location: PACU  Anesthesia Type:General  Level of Consciousness: awake, alert  and oriented  Airway & Oxygen Therapy: Patient Spontanous Breathing and Patient connected to face mask oxygen  Post-op Assessment: Report given to RN and Post -op Vital signs reviewed and stable  Post vital signs: Reviewed and stable  Last Vitals:  Vitals Value Taken Time  BP 114/67 06/08/2018 11:22 AM  Temp 37.1 C 06/08/2018 11:21 AM  Pulse 89 06/08/2018 11:28 AM  Resp 15 06/08/2018 11:28 AM  SpO2 100 % 06/08/2018 11:28 AM  Vitals shown include unvalidated device data.  Last Pain:  Vitals:   06/08/18 1121  TempSrc:   PainSc: Asleep         Complications: No apparent anesthesia complications

## 2018-06-08 NOTE — Anesthesia Preprocedure Evaluation (Signed)
Anesthesia Evaluation  Patient identified by MRN, date of birth, ID band Patient awake    Reviewed: Allergy & Precautions, NPO status , Patient's Chart, lab work & pertinent test results  History of Anesthesia Complications Negative for: history of anesthetic complications  Airway Mallampati: II  TM Distance: >3 FB Neck ROM: Full    Dental  (+) Edentulous Upper, Missing   Pulmonary neg sleep apnea, neg COPD, Current Smoker,    breath sounds clear to auscultation- rhonchi (-) wheezing      Cardiovascular Exercise Tolerance: Good (-) hypertension(-) CAD, (-) Past MI, (-) Cardiac Stents and (-) CABG  Rhythm:Regular Rate:Normal - Systolic murmurs and - Diastolic murmurs    Neuro/Psych PSYCHIATRIC DISORDERS Anxiety Depression negative neurological ROS     GI/Hepatic Neg liver ROS, GERD  ,  Endo/Other  neg diabetesHypothyroidism   Renal/GU negative Renal ROS     Musculoskeletal  (+) Arthritis , Fibromyalgia -  Abdominal (+) - obese,   Peds  Hematology negative hematology ROS (+)   Anesthesia Other Findings Past Medical History: No date: Anxiety No date: Arthritis     Comment:  hands, feet No date: Chronic fatigue No date: Fibromyalgia     Comment:  s/p lyme disease No date: GERD (gastroesophageal reflux disease) No date: Hypothyroidism No date: IBS (irritable bowel syndrome) No date: Lyme disease No date: Seasonal affective disorder (HCC) No date: Shortness of breath dyspnea     Comment:  from inactivity No date: Wears dentures     Comment:  full upper, partial lower (doesn't wear lower)   Reproductive/Obstetrics                             Anesthesia Physical Anesthesia Plan  ASA: II  Anesthesia Plan: General   Post-op Pain Management:  Regional for Post-op pain   Induction: Intravenous  PONV Risk Score and Plan: 1 and Ondansetron, Dexamethasone and Midazolam  Airway  Management Planned: Oral ETT  Additional Equipment:   Intra-op Plan:   Post-operative Plan: Extubation in OR  Informed Consent: I have reviewed the patients History and Physical, chart, labs and discussed the procedure including the risks, benefits and alternatives for the proposed anesthesia with the patient or authorized representative who has indicated his/her understanding and acceptance.   Dental advisory given  Plan Discussed with: CRNA and Anesthesiologist  Anesthesia Plan Comments:         Anesthesia Quick Evaluation

## 2018-06-08 NOTE — Op Note (Signed)
SURGERY DATE: 06/08/2018  PRE-OP DIAGNOSIS:  1. Right subacromial impingement 2. Right biceps split tear 3. Right rotator cuff tear (supraspinatus and subscapularis)  POST-OP DIAGNOSIS: 1. Right subacromial impingement 2. Right biceps split tear 3. Right rotator cuff tear (supraspinatus and subscapularis)  PROCEDURES:  1. Right mini-open rotator cuff repair (supraspinatus and subscapularis) 2. Right open biceps tenodesis 3. Right arthroscopic extensive debridement of shoulder (glenohumeral and subacromial spaces) 4. Right arthroscopic subacromial decompression  SURGEON: Cato Mulligan, MD  ASSISTANT: Anitra Lauth, PA  ANESTHESIA: Gen with Exparil interscalene block  ESTIMATED BLOOD LOSS: 25cc  DRAINS:  none  TOTAL IV FLUIDS: per anesthesia   SPECIMENS: none  IMPLANTS:  - Arthrex SpeedBridge Kit with 4.6m SwiveLock x 4 - Arthrex 4.7361mSwiveLock x2 - Arthrex 1.61m79miberTak - Iconix SPEED double loaded with 1.2 and 2.0mm46mpe x 2   OPERATIVE FINDINGS:  Examination under anesthesia: A careful examination under anesthesia was performed.  Passive range of motion was: FF: 160; ER at side: 85; ER in abduction: 120; IR in abduction: 60.  Anterior load shift: NT.  Posterior load shift: NT.  Sulcus in neutral: NT.  Sulcus in ER: NT.    Intra-operative findings: A thorough arthroscopic examination of the shoulder was performed.  The findings are: 1. Biceps tendon: longitudinal split tear with severe fraying of tendon 2. Superior labrum: degenerative with fraying 3. Posterior labrum and capsule: normal 4. Inferior capsule and inferior recess: normal 5. Glenoid cartilage surface: normal 6. Supraspinatus attachment: full-thickness tear anteriorly 7. Posterior rotator cuff attachment: normal 8. Humeral head articular cartilage: normal 9. Rotator interval: significant synovitis 10: Subscapularis tendon: full-thickness subscapularis tear  11. Anterior labrum: degenerative 12.  IGHL: normal  OPERATIVE REPORT:   Indications for procedure: Judy Salinas 61 y38. female with ~3 months of R shoulder pain. Clinical exam and MRI were suggestive of rotator cuff tear (supraspinatus and subscapularis), biceps pathology, and subacromial impingement.  Given full thickness tears of subscapularis and supraspinatus, we elected to proceed with surgical management as that would likely give her a better outcome compared to continued non-operative management. After discussion of risks, benefits, and alternatives to surgery, the patient elected to proceed. She understands that there is a significantly higher risk of rotator cuff not healing and risk of complications and inferior outcome given that she smokes.   Procedure in detail:  I identified Judy GRIFFINGthe pre-operative holding area.  I marked the operative shoulder with my initials. I reviewed the risks and benefits of the proposed surgical intervention, and the patient (and/or patient's guardian) wished to proceed.  Anesthesia was then performed with an Exparil interscalene block.  The patient was transferred to the operative suite and placed in the beach chair position.    SCDs were placed on the lower extremities. Appropriate IV antibiotics were administered prior to incision. The operative upper extremity was then prepped and draped in standard fashion. A time out was performed confirming the correct extremity, correct patient, and correct procedure.   I then created a standard posterior portal with an 11 blade. The glenohumeral joint was easily entered with a blunt trochar and the arthroscope introduced. The findings of diagnostic arthroscopy are described above. I debrided degenerative tissue including the synovitic tissue about the rotator interval and anterior and superior labrum. I then coagulated the inflamed synovium to obtain hemostasis and reduce the risk of post-operative swelling using an Arthrocare  radiofrequency device. I performed a biceps tenotomy using an  arthroscopic scissors and used a motorized shaver to debride the stump back to a stable base. The tissue about the subscapularis was debrided to better define the tear. The comma tissue was then tagged with a FiberWire suture and decision was made to perform this repair in an open fashion.   Next, the arthroscope was then introduced into the subacromial space. A direct lateral portal was created with an 11-blade after spinal needle localization. An extensive subacromial bursectomy was performed using a combination of the shaver and Arthrocare wand. The entire acromial undersurface was exposed and the CA ligament was subperiosteally elevated to expose the anterior acromial hook. A 5.21m barrel burr was used to create a flat anterior and lateral aspect of the acromion, converting it from a Type 2 to a Type 1 acromion. Care was made to keep the deltoid fascia intact.  A longitudinal incision from the anterolateral acromion ~7cm in length was made overlying the raphe between the anterior and middle heads of the deltoid. The raphe was identified and it was incised. The subacromial space was identified. Any remaining bursa was excised. The supraspinatus tear was identified. It was a U-shaped tear.    We then turned our attention to the biceps tenodesis. The arm was externally rotated.  The bicipital groove was identified.  A 15 blade was used to make a cut overlying the biceps tendon, and the tendon was removed using a right angle clamp.  The base of the bicipital groove was identified and cleared of soft tissue.  A FiberTak anchor was placed low in the bicipital groove. The biceps tendon was held at the appropriate amount of tension.  One set of sutures was passed through the biceps anchor with one limb passed in a simple fashion and the second limb passed in a simple plus locking stitch pattern.  This was repeated for the other set of sutures.  This  construct allowed for shuttling the biceps tendon down to the bone.  The sutures were tied and cut.  The diseased portion of the proximal biceps was then excised.  Next, we performed the subscapularis repair. It was completely torn and the lesser tuberosity was bare at the subscapularis footprint.  The footprint was prepared with a rongeur to clear all soft tissue and then create a bleeding surface.  An Arthrex speed bridge kit was used to repair the subscapularis.  2 Medial Row anchors were placed at the medial aspect of the subscapularis footprint.  The sutures were passed through the subscapularis.  The sutures were split and one limb of each suture was then passed through each of two Lateral Row anchors (one superior and one inferior) at the lateral aspect of the footprint just medial to the bicipital groove.  The FiberWire suture from the inferior lateral row anchor was then used to further secure the inferior portion of the subscapularis by passing a mattress suture through and then tying it.  This construct nicely secured the subscapularis to the lesser tuberosity.  The arm was then internally rotated.  The supraspinatus footprint was cleared of soft tissue with a rongeur.  A bleeding bed was also created with a rongeur. Two Iconix SPEED anchors were placed just lateral to the articular margin.  All 4 strands of suture from the anterior anchor were passed through the anterior cuff.  All 4 strands of suture from the posterior anchor were passed through the posterior cuff. Two SwiveLock anchors were placed for the lateral row anchors with one limb of each  of the four medial row sutures passed through each anchor.  This allowed for excellent reapproximation of the rotator cuff over its footprint. The posterior anchor #2 FiberWire from the SwiveLock anchor was additionally passed through a dog ear posteriorly in a mattress fashion and then tied.  This nicely reapproximated the supraspinatus over its footprint  with excellent compression.  The construct was stable with external and internal rotation.  The wound was thoroughly irrigated.  The deltoid split was closed with 0 Vicryl.  The subdermal layer was closed with 2-0 Vicryl.  The skin was closed with 4-0 Monocryl and Dermabond. The portals were closed with 3-0 Nylon. Xeroform was applied to the incisions. A sterile dressing was applied, followed by a Polar Care sleeve and a SlingShot shoulder immobilizer/sling. The patient was awakened from anesthesia without difficulty and was transferred to the PACU in stable condition.   This case required additional complexity compared to the standard rotator cuff repair given that the patient had a full-thickness subscapularis tear.  This required placement of an additional 4 anchors for repair with corresponding suture passage.  This increased surgical time and complexity by approximately 25%.  Of note, assistance from a PA was essential to performing the surgery. PA assisted with patient positioning, retraction, and instrumentation. The surgery would have been more difficult and had longer operative time without PA assistance.   COMPLICATIONS: none  DISPOSITION: plan for discharge home after recovery in PACU   POSTOPERATIVE PLAN: Remain in sling (except hygiene and elbow/wrist/hand RoM exercises as instructed by PT) x 6 weeks and NWB for this time. PT to begin 3-4 days after surgery. Rotator cuff repair with subscapularis repair and biceps tenodesis rehab protocol. ASA 335m daily x 2 weeks for DVT ppx.

## 2018-06-08 NOTE — Anesthesia Post-op Follow-up Note (Signed)
Anesthesia QCDR form completed.        

## 2018-06-08 NOTE — H&P (Signed)
Paper H&P to be scanned into permanent record. H&P reviewed. No significant changes noted.  

## 2018-06-08 NOTE — Discharge Instructions (Signed)

## 2018-06-08 NOTE — Anesthesia Procedure Notes (Signed)
Anesthesia Regional Block: Interscalene brachial plexus block   Pre-Anesthetic Checklist: ,, timeout performed, Correct Patient, Correct Site, Correct Laterality, Correct Procedure, Correct Position, site marked, Risks and benefits discussed,  Surgical consent,  Pre-op evaluation,  At surgeon's request and post-op pain management  Laterality: Right  Prep: chloraprep       Needles:  Injection technique: Single-shot  Needle Type: Stimiplex     Needle Length: 10cm  Needle Gauge: 21     Additional Needles:   Procedures:,,,, ultrasound used (permanent image in chart),,,,  Narrative:  Start time: 06/08/2018 7:20 AM End time: 06/08/2018 7:26 AM Injection made incrementally with aspirations every 5 mL.  Performed by: Personally  Anesthesiologist: Emmie Niemann, MD  Additional Notes: Functioning IV was confirmed and monitors were applied.  A Stimuplex needle was used. Sterile prep and drape,hand hygiene and sterile gloves were used.  Negative aspiration and negative test dose prior to incremental administration of local anesthetic. The patient tolerated the procedure well.

## 2018-06-09 ENCOUNTER — Encounter: Payer: Self-pay | Admitting: Orthopedic Surgery

## 2018-10-05 ENCOUNTER — Ambulatory Visit: Payer: Medicare Other | Admitting: Family Medicine

## 2018-11-05 DIAGNOSIS — R6889 Other general symptoms and signs: Secondary | ICD-10-CM | POA: Insufficient documentation

## 2018-11-05 DIAGNOSIS — K449 Diaphragmatic hernia without obstruction or gangrene: Secondary | ICD-10-CM | POA: Insufficient documentation

## 2018-11-05 DIAGNOSIS — Z972 Presence of dental prosthetic device (complete) (partial): Secondary | ICD-10-CM | POA: Insufficient documentation

## 2018-11-05 DIAGNOSIS — A6920 Lyme disease, unspecified: Secondary | ICD-10-CM | POA: Insufficient documentation

## 2018-11-05 DIAGNOSIS — K219 Gastro-esophageal reflux disease without esophagitis: Secondary | ICD-10-CM | POA: Insufficient documentation

## 2018-11-05 DIAGNOSIS — R5382 Chronic fatigue, unspecified: Secondary | ICD-10-CM | POA: Insufficient documentation

## 2019-02-12 ENCOUNTER — Other Ambulatory Visit: Payer: Self-pay | Admitting: Otolaryngology

## 2019-02-12 DIAGNOSIS — D110 Benign neoplasm of parotid gland: Secondary | ICD-10-CM

## 2019-02-17 DIAGNOSIS — Z8639 Personal history of other endocrine, nutritional and metabolic disease: Secondary | ICD-10-CM | POA: Insufficient documentation

## 2019-02-23 ENCOUNTER — Other Ambulatory Visit: Payer: Self-pay

## 2019-02-23 ENCOUNTER — Ambulatory Visit
Admission: RE | Admit: 2019-02-23 | Discharge: 2019-02-23 | Disposition: A | Discharge status: Home / Self Care | Payer: Medicare Other | Source: Ambulatory Visit | Attending: Otolaryngology | Admitting: Otolaryngology

## 2019-02-23 DIAGNOSIS — D110 Benign neoplasm of parotid gland: Secondary | ICD-10-CM | POA: Insufficient documentation

## 2019-02-23 LAB — POCT I-STAT CREATININE: Creatinine, Ser: 0.8 mg/dL (ref 0.44–1.00)

## 2019-02-23 MED ORDER — IOHEXOL 300 MG/ML  SOLN
75.0000 mL | Freq: Once | INTRAMUSCULAR | Status: AC | PRN
  Administered 2019-02-23: 75 mL via INTRAVENOUS

## 2019-11-30 DIAGNOSIS — J438 Other emphysema: Secondary | ICD-10-CM | POA: Insufficient documentation

## 2020-02-17 IMAGING — MR MR SHOULDER*R* W/O CM
5 series · 40 of 40 positions shown · non-contrast
Comparison: None.

CLINICAL DATA: Right shoulder pain for several months.

EXAM:
MRI OF THE RIGHT SHOULDER WITHOUT CONTRAST
TECHNIQUE: Multiplanar, multisequence MR imaging of the shoulder was performed.
No intravenous contrast was administered.

[Series 3: PD fat-sat · axial · 4.0mm · 0.59mm/px · z∈[-21,+71]mm · 8 of 22 slices shown]
[im 1/22]
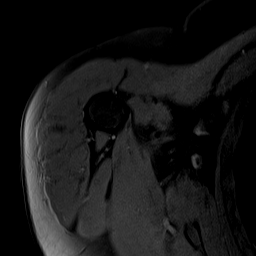
[im 4/22]
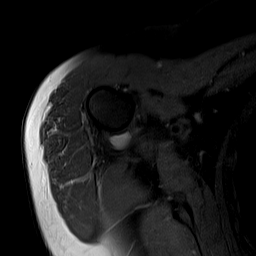
[im 7/22]
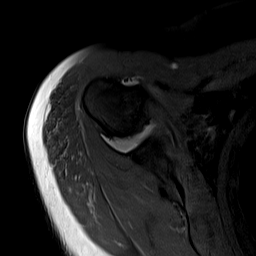
[im 10/22]
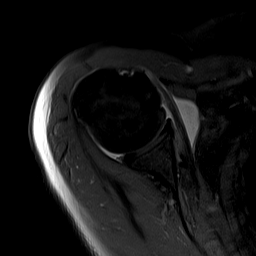
[im 13/22]
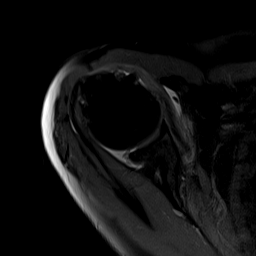
[im 16/22]
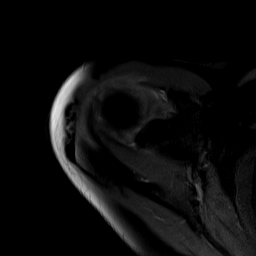
[im 19/22]
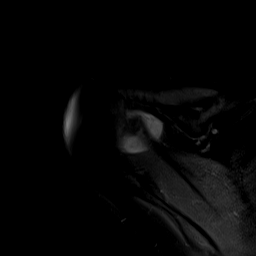
[im 22/22]
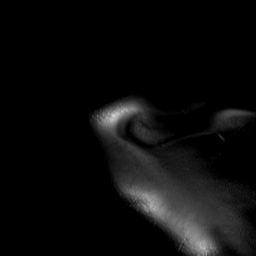

[Series 5: T2 fat-sat · oblique · 4.0mm · 0.62mm/px · 7 of 19 slices shown (1 of 2)]
[im 1/19]
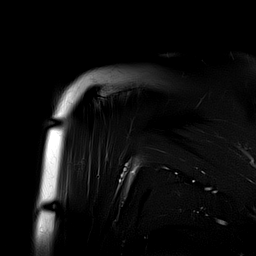
[im 4/19]
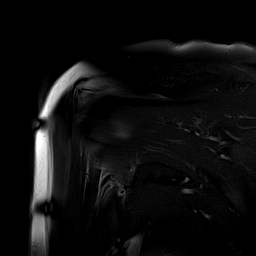
[im 7/19]
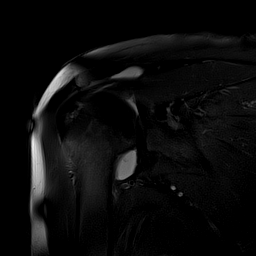
[im 10/19]
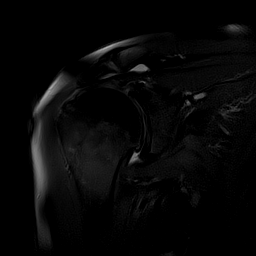
[im 13/19]
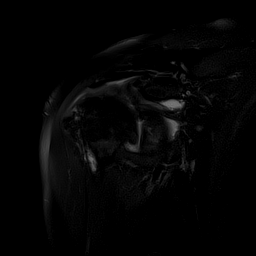
[im 16/19]
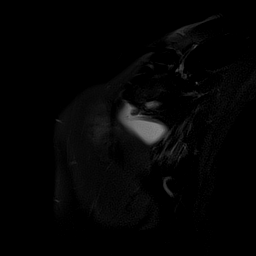
[im 19/19]
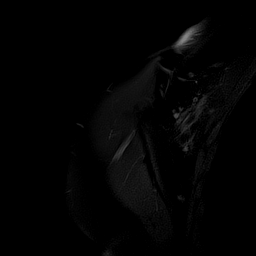

[Series 6: PD · oblique · 4.0mm · 0.62mm/px · 7 of 19 slices shown]
[im 1/19]
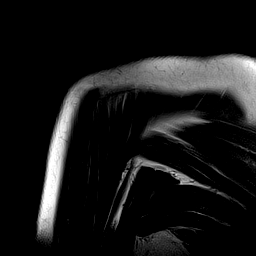
[im 4/19]
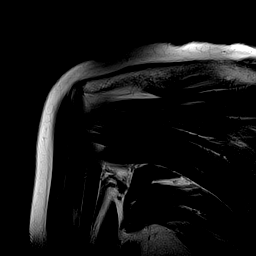
[im 7/19]
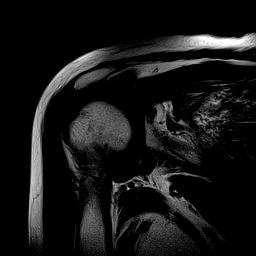
[im 10/19]
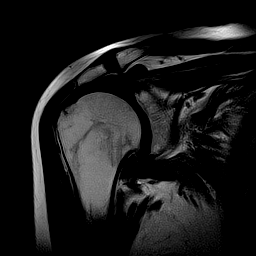
[im 13/19]
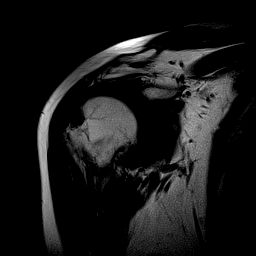
[im 16/19]
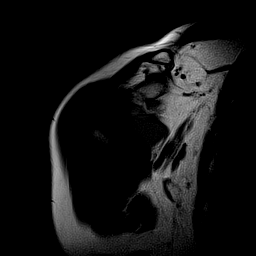
[im 19/19]
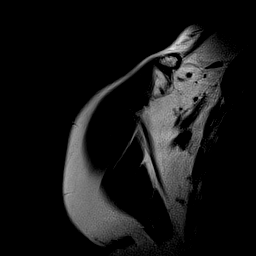

[Series 7: T1 · oblique · 4.0mm · 0.59mm/px · 9 of 23 slices shown]
[im 1/23]
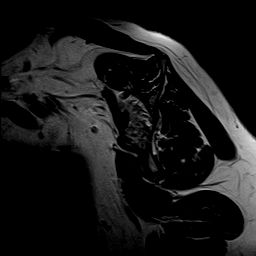
[im 3/23]
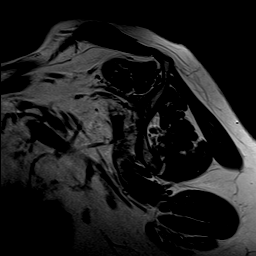
[im 6/23]
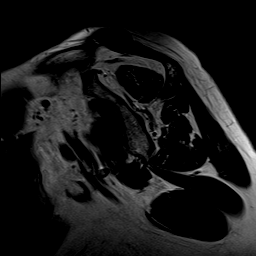
[im 9/23]
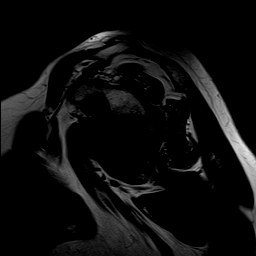
[im 12/23]
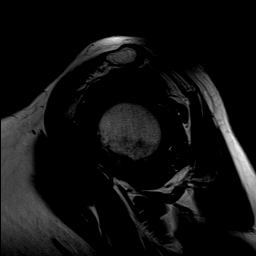
[im 14/23]
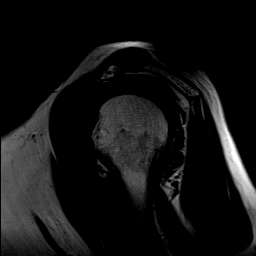
[im 17/23]
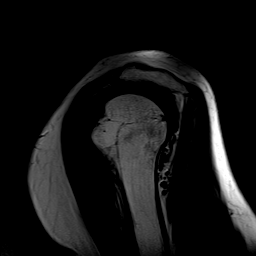
[im 20/23]
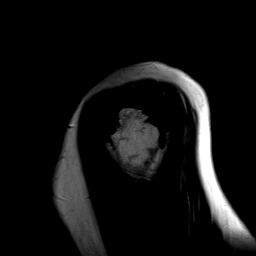
[im 23/23]
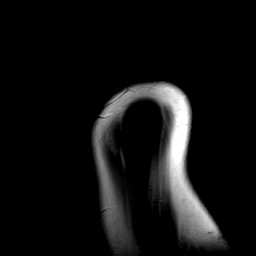

[Series 8: T2 fat-sat · oblique · 4.0mm · 0.55mm/px · 9 of 23 slices shown (2 of 2)]
[im 1/23]
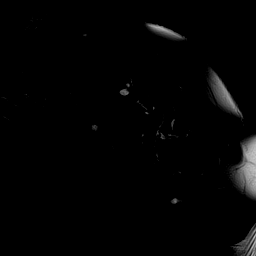
[im 3/23]
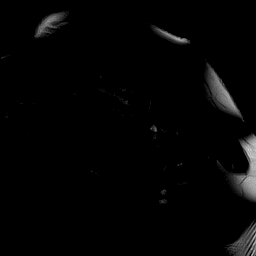
[im 6/23]
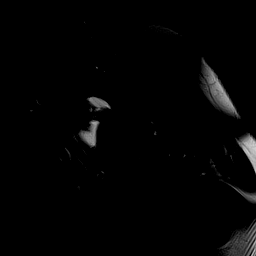
[im 9/23]
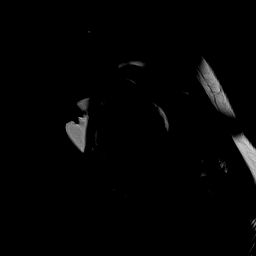
[im 12/23]
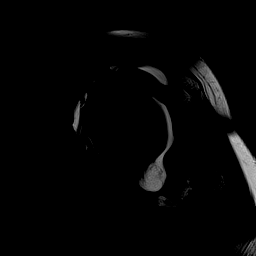
[im 14/23]
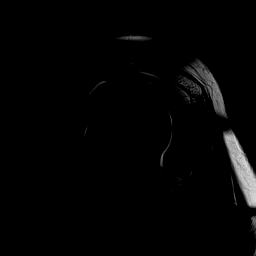
[im 17/23]
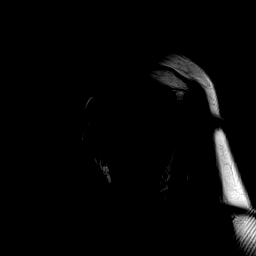
[im 20/23]
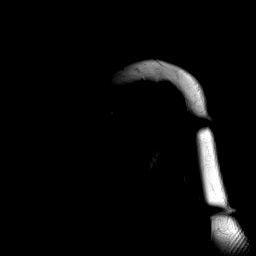
[im 23/23]
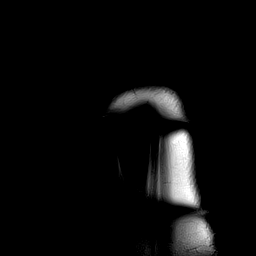

[40 of 40 positions shown; findings below may reference images not displayed]

FINDINGS: Rotator cuff: The patient has a complete supraspinatus tendon tear
with retraction just medial to the top of the humeral head, 2-3 cm.
The distal subscapularis tendon is markedly attenuated consistent
with high-grade partial tear. No retraction is identified. The
infraspinatus is intact.

Muscles: Moderate fatty atrophy of the subscapularis is identified.
Musculature is otherwise preserved.

Biceps long head: The tendon is completely torn from the superior
labrum.

Acromioclavicular Joint: Very mild degenerative change is present.
Type 2 acromion. A small volume of fluid is present in the
subacromial/subdeltoid bursa.

Glenohumeral Joint: Mild cartilage thinning and a small osteophyte
off the humeral head are identified.

Labrum: The superior labrum is blunted without focal tear
identified.

Bones:  No fracture or worrisome lesion.

Other: None.
IMPRESSION: Complete supraspinatus tendon tear with 2-3 cm of retraction but no
atrophy.

Markedly attenuated subscapularis tendon consistent with high-grade
partial tear. No tendon retraction is identified but there is
moderate fatty atrophy of the subscapularis muscle belly.

Complete tear of the long head of biceps from the superior labrum.

Minimal acromioclavicular osteoarthritis.

Small volume of fluid in the subacromial/subdeltoid bursa compatible
with bursitis.

Mild glenohumeral osteoarthritis.

## 2020-02-17 IMAGING — MR MR CERVICAL SPINE W/O CM
5 series · 35 of 48 positions shown · non-contrast
Comparison: Neck CT [DATE]

CLINICAL DATA: Neck pain extending to the right shoulder with
numbness and tingling in the hands.

EXAM:
MRI CERVICAL SPINE WITHOUT CONTRAST
TECHNIQUE: Multiplanar, multisequence MR imaging of the cervical spine was
performed. No intravenous contrast was administered.

[Series 2: T2 · sagittal · 3.0mm · 0.56mm/px · 8 of 13 slices shown (1 of 2)]
[im 1/13]
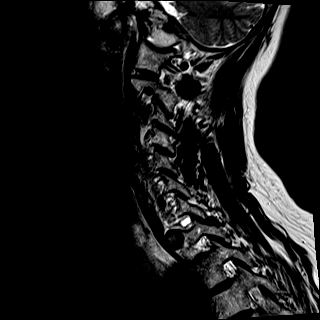
[im 2/13]
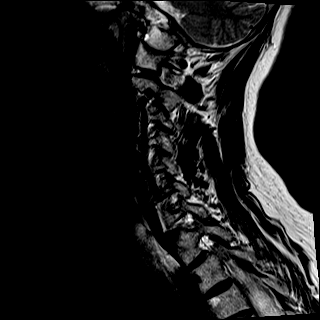
[im 4/13]
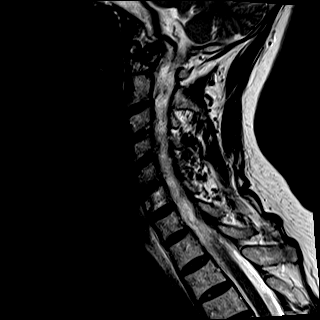
[im 6/13]
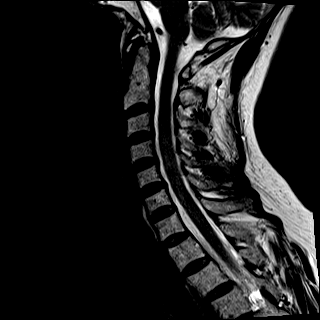
[im 7/13]
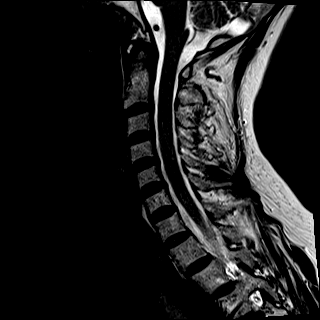
[im 9/13]
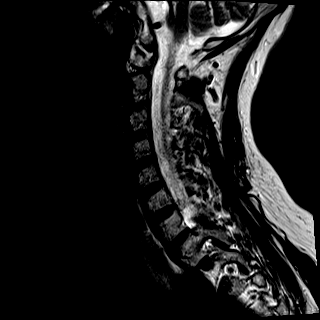
[im 11/13]
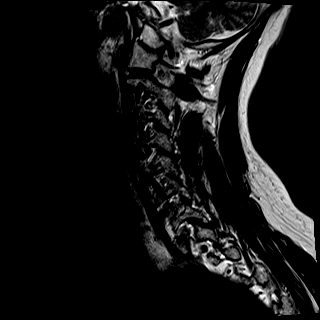
[im 13/13]
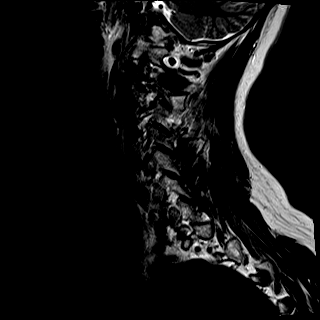

[Series 3: T1 · sagittal · 3.0mm · 0.70mm/px · 7 of 13 slices shown]
[im 1/13]
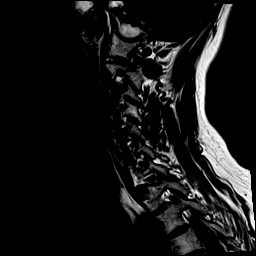
[im 3/13]
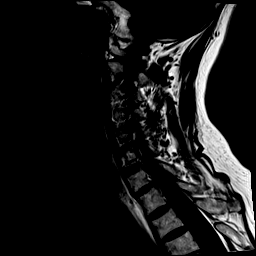
[im 5/13]
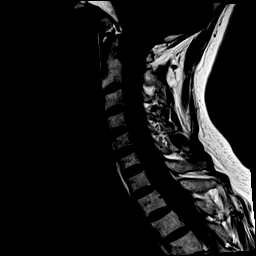
[im 7/13]
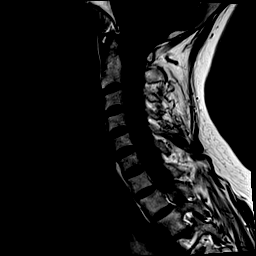
[im 9/13]
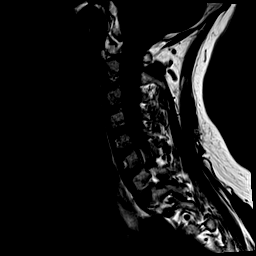
[im 11/13]
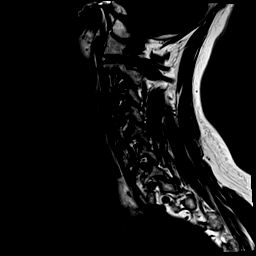
[im 13/13]
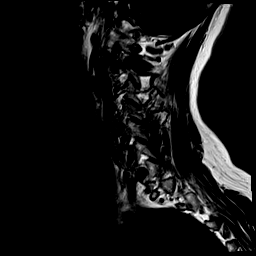

[Series 4: STIR · sagittal · 3.0mm · 0.35mm/px · 7 of 13 slices shown]
[im 1/13]
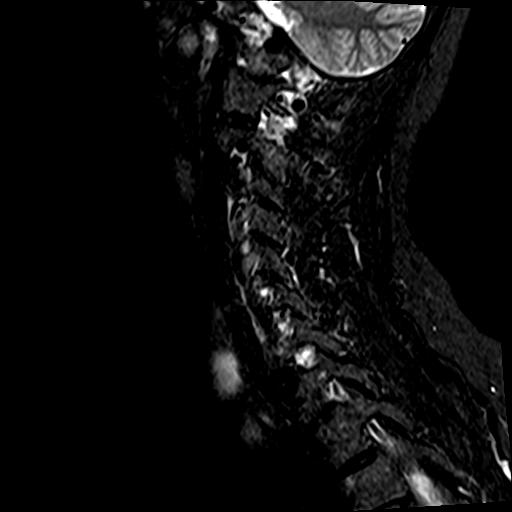
[im 3/13]
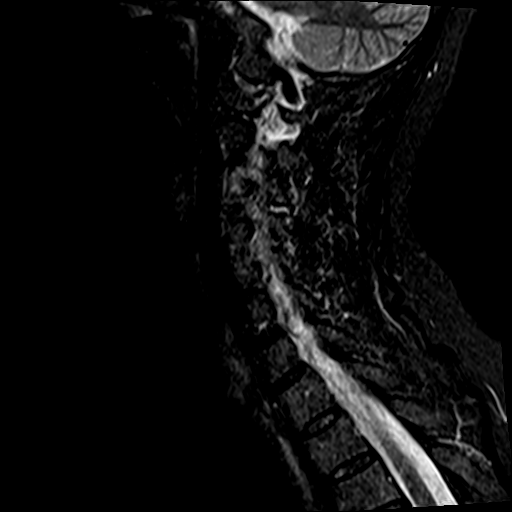
[im 5/13]
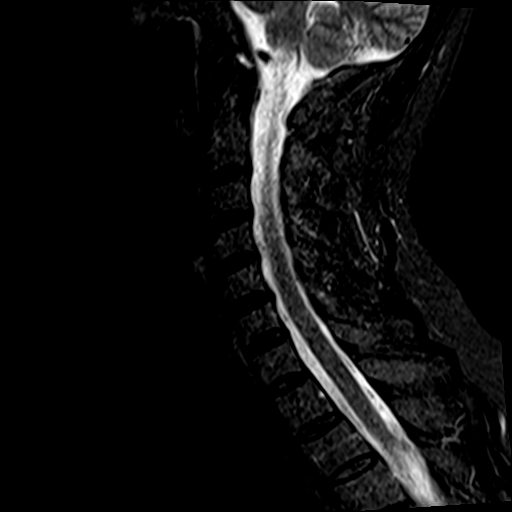
[im 7/13]
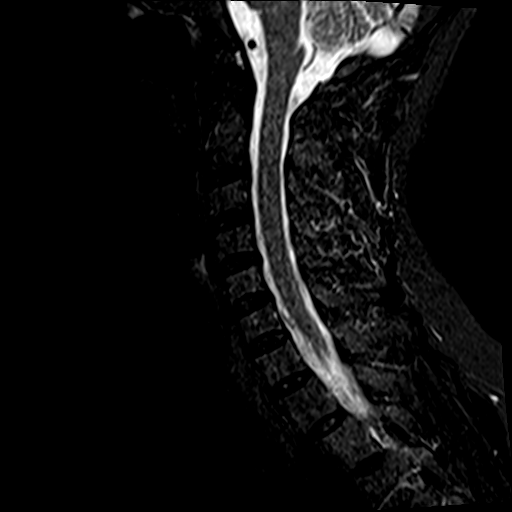
[im 9/13]
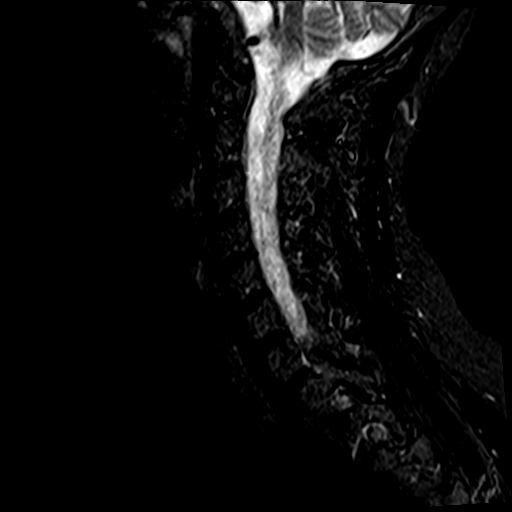
[im 11/13]
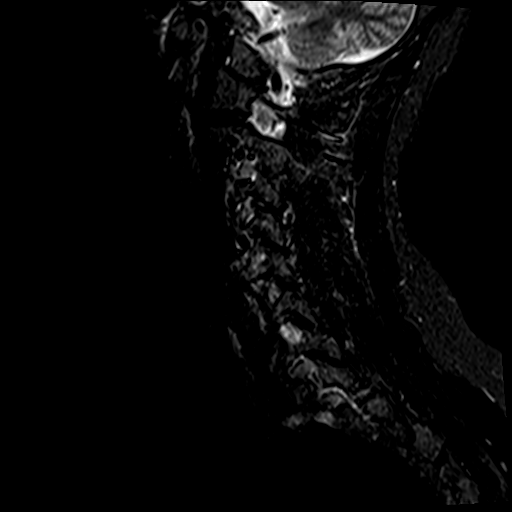
[im 13/13]
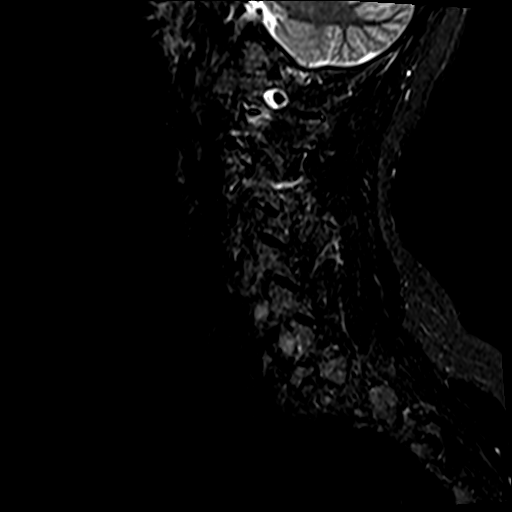

[Series 5: T2 · axial · 3.0mm · 0.62mm/px · z∈[-79,+7]mm · 9 of 25 slices shown (2 of 2)]
[im 1/25]
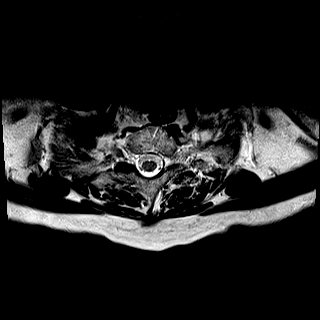
[im 5/25]
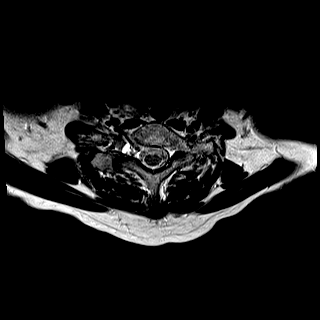
[im 9/25]
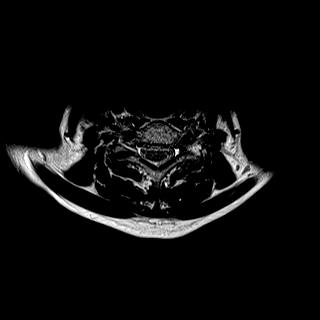
[im 11/25]
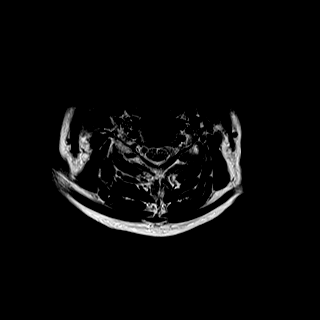
[im 13/25]
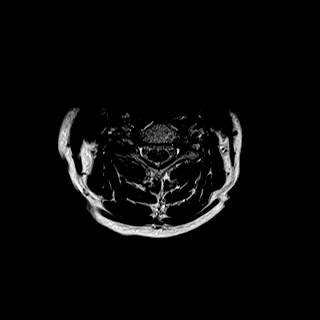
[im 15/25]
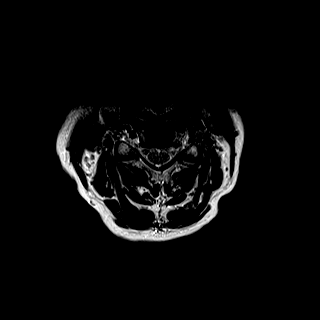
[im 17/25]
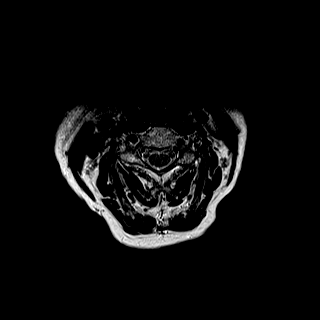
[im 21/25]
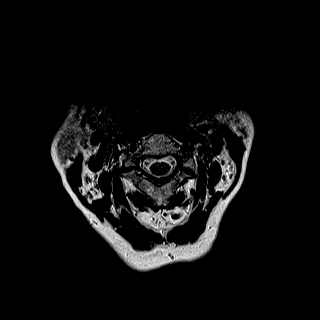
[im 25/25]
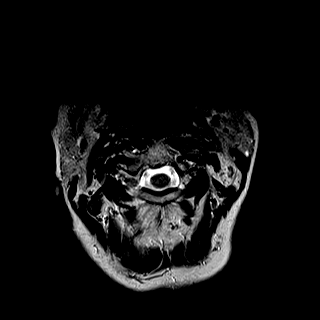

[Series 6: mpgr ax · axial · 3.0mm · 0.39mm/px · z∈[-70,-34]mm · 4 of 25 slices shown]
[im 1/25]
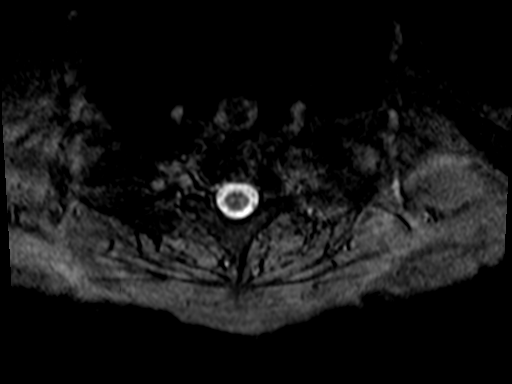
[im 5/25]
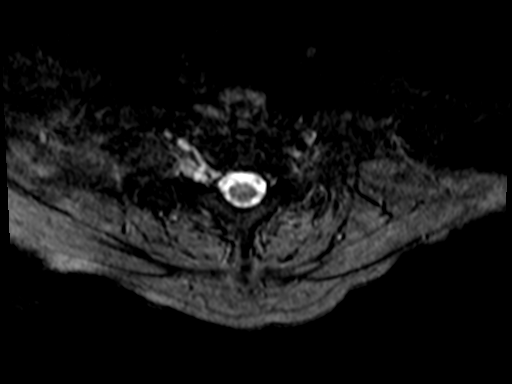
[im 9/25]
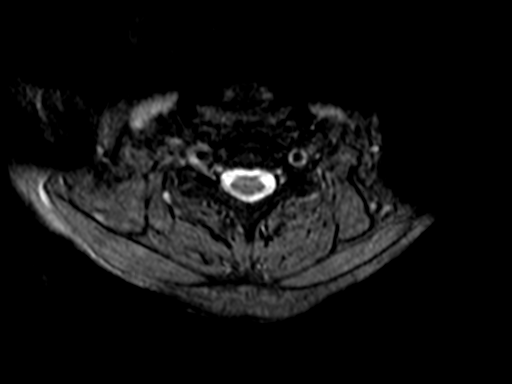
[im 11/25]
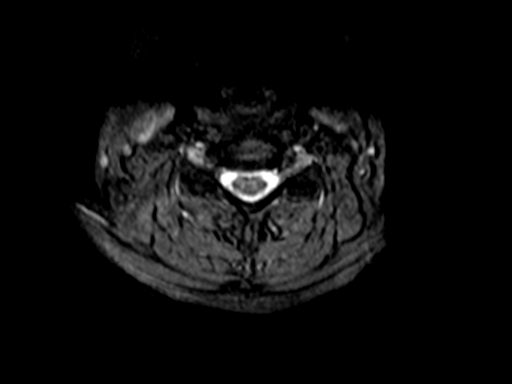

[35 of 48 positions shown; findings below may reference images not displayed]

FINDINGS: Alignment: Normal

Vertebrae: Normal

Cord: Normal.  No cord compression or primary cord lesion.

Posterior Fossa, vertebral arteries, paraspinal tissues: Normal

Disc levels:

There is no evidence significant disc degeneration. There is no disc
herniation or stenosis of the central canal or foramina. There are
minimal non-compressive disc bulges at C4-5 and C5-6, likely
subclinical. No evidence of advanced or edematous facet arthropathy.
IMPRESSION: Negative study of the cervical region. Minimal non-compressive disc
bulges at C4-5 and C5-6, not likely to be symptomatic.

## 2020-10-05 ENCOUNTER — Other Ambulatory Visit: Payer: Self-pay | Admitting: Gastroenterology

## 2020-10-05 DIAGNOSIS — R1013 Epigastric pain: Secondary | ICD-10-CM

## 2020-10-05 DIAGNOSIS — K582 Mixed irritable bowel syndrome: Secondary | ICD-10-CM

## 2020-10-05 DIAGNOSIS — R195 Other fecal abnormalities: Secondary | ICD-10-CM

## 2020-10-05 DIAGNOSIS — K219 Gastro-esophageal reflux disease without esophagitis: Secondary | ICD-10-CM

## 2020-10-18 ENCOUNTER — Other Ambulatory Visit: Payer: Self-pay

## 2020-10-18 ENCOUNTER — Ambulatory Visit
Admission: RE | Admit: 2020-10-18 | Discharge: 2020-10-18 | Disposition: A | Discharge status: Home / Self Care | Payer: Medicare Other | Source: Ambulatory Visit | Attending: Gastroenterology | Admitting: Gastroenterology

## 2020-10-18 DIAGNOSIS — R195 Other fecal abnormalities: Secondary | ICD-10-CM | POA: Insufficient documentation

## 2020-10-18 DIAGNOSIS — K219 Gastro-esophageal reflux disease without esophagitis: Secondary | ICD-10-CM | POA: Insufficient documentation

## 2020-10-18 DIAGNOSIS — K582 Mixed irritable bowel syndrome: Secondary | ICD-10-CM | POA: Insufficient documentation

## 2020-10-18 DIAGNOSIS — R1013 Epigastric pain: Secondary | ICD-10-CM | POA: Diagnosis present

## 2020-10-18 LAB — POCT I-STAT CREATININE: Creatinine, Ser: 0.8 mg/dL (ref 0.44–1.00)

## 2020-10-18 MED ORDER — IOHEXOL 300 MG/ML  SOLN
100.0000 mL | Freq: Once | INTRAMUSCULAR | Status: AC | PRN
  Administered 2020-10-18: 100 mL via INTRAVENOUS

## 2021-03-26 DIAGNOSIS — F419 Anxiety disorder, unspecified: Secondary | ICD-10-CM | POA: Insufficient documentation

## 2021-04-18 ENCOUNTER — Other Ambulatory Visit: Payer: Self-pay | Admitting: Podiatry

## 2021-04-18 ENCOUNTER — Other Ambulatory Visit: Payer: Self-pay

## 2021-04-18 ENCOUNTER — Ambulatory Visit (INDEPENDENT_AMBULATORY_CARE_PROVIDER_SITE_OTHER): Payer: Medicare Other | Admitting: Podiatry

## 2021-04-18 ENCOUNTER — Encounter: Payer: Self-pay | Admitting: Podiatry

## 2021-04-18 ENCOUNTER — Ambulatory Visit (INDEPENDENT_AMBULATORY_CARE_PROVIDER_SITE_OTHER): Payer: Medicare Other

## 2021-04-18 DIAGNOSIS — M722 Plantar fascial fibromatosis: Secondary | ICD-10-CM

## 2021-04-18 DIAGNOSIS — M778 Other enthesopathies, not elsewhere classified: Secondary | ICD-10-CM

## 2021-04-18 MED ORDER — METHYLPREDNISOLONE 4 MG PO TBPK: ORAL_TABLET | ORAL | 0 refills | Status: DC

## 2021-04-18 MED ORDER — TRIAMCINOLONE ACETONIDE 40 MG/ML IJ SUSP
20.0000 mg | Freq: Once | INTRAMUSCULAR | Status: AC
  Administered 2021-04-18: 20 mg

## 2021-04-18 NOTE — Patient Instructions (Signed)

## 2021-04-18 NOTE — Progress Notes (Signed)
Subjective:  Patient ID: Judy Salinas, female    DOB: [birth year],  MRN: [id number] HPI Chief Complaint  Patient presents with   Foot Pain    Multiple areas of pain left - lateral lower leg, lateral side of foot, 5th met base and 5th toe, plantar heel, 1st toe - fell multiple times x 5 months ago, since whole leg has hurt, 5th toe crosses over forefoot, 5th met base tender and puffy, heel hurts walking, especially barefoot   Toe Pain    3rd toe right - lateral border tender   Foot Pain    Check fibromas right    New Patient (Initial Visit)    Est pt 2018    65 y.o. female presents with the above complaint.   ROS: Denies fever chills nausea vomiting muscle aches pains calf pain back pain chest pain shortness of breath.  Past Medical History:  Diagnosis Date   Anxiety    Arthritis    hands, feet   Chronic fatigue    Fibromyalgia    s/p lyme disease   GERD (gastroesophageal reflux disease)    Hypothyroidism    IBS (irritable bowel syndrome)    Lyme disease    Seasonal affective disorder (HCC)    Shortness of breath dyspnea    from inactivity   Wears dentures    full upper, partial lower (doesn't wear lower)   Past Surgical History:  Procedure Laterality Date   ABDOMINAL HYSTERECTOMY  1984   APPENDECTOMY     BACK SURGERY     BREAST SURGERY Right    lumpectomy   FOOT SURGERY Left    x2   LAPAROSCOPIC APPENDECTOMY N/A 08/15/2017   Procedure: APPENDECTOMY LAPAROSCOPIC;  Surgeon: Olean Ree, MD;  Location: [facility];  Service: General;  Laterality: N/A;   PREAURICULAR CYST EXCISION Left 10/31/2015   Procedure: EXCISION POST AURICULAR CYST ADULT;  Surgeon: Clyde Canterbury, MD;  Location: [place];  Service: ENT;  Laterality: Left;   SEPTOPLASTY  2008   [place], Dr. Kathyrn Sheriff   SHOULDER ARTHROSCOPY WITH OPEN ROTATOR CUFF REPAIR Right 06/08/2018   Procedure: SHOULDER ARTHROSCOPY WITH MINI OPEN ROTATOR CUFF REPAIR,BICEPS TENODESIS,SUBSCAPULARIS REPAIR,SUBCROMINAL  DECOMPRESSION;  Surgeon: Leim Fabry, MD;  Location: [facility];  Service: Orthopedics;  Laterality: Right;   TONSILLECTOMY N/A 10/31/2015   Procedure: TONSILLECTOMY;  Surgeon: Clyde Canterbury, MD;  Location: [place];  Service: ENT;  Laterality: N/A;    Current Outpatient Medications:    ALPRAZolam (XANAX) 0.5 MG tablet, Take 0.5 mg by mouth 2 (two) times daily., Disp: , Rfl:    Ascorbic Acid (VITAMIN C PO), Take by mouth., Disp: , Rfl:    B Complex Vitamins (B COMPLEX PO), Take by mouth., Disp: , Rfl:    cyclobenzaprine (FLEXERIL) 10 MG tablet, Take 10 mg by mouth 3 (three) times daily., Disp: , Rfl:    dicyclomine (BENTYL) 10 MG capsule, Take 10 mg by mouth 4 (four) times daily -  before meals and at bedtime., Disp: , Rfl:    doxycycline (MONODOX) 100 MG capsule, Take 100 mg by mouth 2 (two) times daily., Disp: , Rfl:    esomeprazole (NEXIUM) 40 MG capsule, Take 40 mg by mouth daily. , Disp: , Rfl:    ezetimibe (ZETIA) 10 MG tablet, Take 10 mg by mouth at bedtime. , Disp: , Rfl:    fluticasone (FLONASE) 50 MCG/ACT nasal spray, Place 2 sprays into both nostrils daily. AM, Disp: , Rfl:    hydrocortisone 2.5 %  cream, Apply topically 2 (two) times daily., Disp: , Rfl:    Iron-Vitamins (GERITOL PO), Take by mouth., Disp: , Rfl:    linaclotide (LINZESS) 290 MCG CAPS capsule, Take 290 mcg by mouth daily., Disp: , Rfl:    MAGNESIUM PO, Take 500 mg by mouth 3 (three) times daily. , Disp: , Rfl:    methylPREDNISolone (MEDROL DOSEPAK) 4 MG TBPK tablet, 6 day dose pack - take as directed, Disp: 21 tablet, Rfl: 0   montelukast (SINGULAIR) 10 MG tablet, Take 10 mg by mouth at bedtime., Disp: , Rfl:    Potassium 99 MG TABS, Take 1 tablet by mouth daily., Disp: , Rfl:    promethazine (PHENERGAN) 25 MG tablet, Take 25 mg by mouth every 6 (six) hours as needed., Disp: , Rfl:    senna-docusate (SENOKOT-S) 8.6-50 MG tablet, Take by mouth 3 (three) times daily. , Disp: , Rfl:    SYNTHROID 125 MCG tablet,  Take 125 mcg by mouth daily., Disp: , Rfl:    traMADol (ULTRAM) 50 MG tablet, Take 50 mg by mouth every 6 (six) hours as needed., Disp: , Rfl:    traZODone (DESYREL) 100 MG tablet, Take 100 mg by mouth at bedtime., Disp: , Rfl:    vitamin B-12 (CYANOCOBALAMIN) 500 MCG tablet, Take 2,000 mcg by mouth daily., Disp: , Rfl:    Zinc Acetate, Oral, (ZINC ACETATE PO), Take by mouth., Disp: , Rfl:   Allergies  Allergen Reactions   Amitriptyline Shortness Of Breath    And rash   Nortriptyline Shortness Of Breath    And rash   Baclofen Hives   Bactrim [Sulfamethoxazole-Trimethoprim] Hives   Claritin-D 12 Hour [Loratadine-Pseudoephedrine Er] Hives and Swelling   Codeine Hives   Diclofenac Hives   Duloxetine     Other reaction(s): Unknown   Esomeprazole Swelling    Angioedema (generic only)   Guaifenesin     Other reaction(s): Unknown   Ibuprofen Swelling    Angioedema and rash   Loratadine Hives and Swelling   Milnacipran     Other reaction(s): Other, Unknown   Penicillins Hives    Has patient had a PCN reaction causing immediate rash, facial/tongue/throat swelling, SOB or lightheadedness with hypotension: Yes Has patient had a PCN reaction causing severe rash involving mucus membranes or skin necrosis: Unknown Has patient had a PCN reaction that required hospitalization: No Has patient had a PCN reaction occurring within the last 10 years: No If all of the above answers are "NO", then may proceed with Cephalosporin use.    Pregabalin     Other reaction(s): Unknown, Unknown   Remeron [Mirtazapine] Hives   Sulfa Antibiotics Swelling    angioedema   Ultram [Tramadol Hcl] Swelling    Angioedema (generic only)   Vicodin [Hydrocodone-Acetaminophen] Hives   Wellbutrin [Bupropion]     Altered mental status   Zoloft [Sertraline Hcl] Hives   Neurontin [Gabapentin] Rash   Nickel Rash    Burning stinging rash- per pt highly allergic    Review of Systems Objective:  There were no vitals  filed for this visit.  General: Well developed, nourished, in no acute distress, alert and oriented x3   Dermatological: Skin is warm, dry and supple bilateral. Nails x 10 are well maintained; remaining integument appears unremarkable at this time. There are no open sores, no preulcerative lesions, no rash or signs of infection present.  Ingrown toenail fibular border third digit right foot no erythema edema cellulitis drainage or odor.  Vascular: Dorsalis  Pedis artery and Posterior Tibial artery pedal pulses are 2/4 bilateral with immedate capillary fill time. Pedal hair growth present. No varicosities and no lower extremity edema present bilateral.   Neruologic: Grossly intact via light touch bilateral. Vibratory intact via tuning fork bilateral. Protective threshold with Semmes Wienstein monofilament intact to all pedal sites bilateral. Patellar and Achilles deep tendon reflexes 2+ bilateral. No Babinski or clonus noted bilateral.   Musculoskeletal: No gross boney pedal deformities bilateral. No pain, crepitus, or limitation noted with foot and ankle range of motion bilateral. Muscular strength 5/5 in all groups tested bilateral.  Tailor's bunion deformity is present with mild tenderness and soft tissue swelling surrounding the bone.  She also has some soft tissue swelling around the fifth metatarsal base.  This most likely is the lateral compensatory syndrome secondary to medial plantar fasciitis.  She has pain on palpation medial calcaneal tubercle of the left heel.  Gait: Unassisted, Nonantalgic.    Radiographs:  Radiographs taken today of bilateral foot demonstrate rectus foot right with no acute findings.  Soft tissue swelling of the plantar fascia most likely associated with her fibromas.  Left foot demonstrates osseously mature individual tailor's bunion deformity left.  Soft tissue swelling overlying the fifth metatarsal base and soft tissue swelling at the plantar fashion calcaneal  insertion site.  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  Ingrown toenail third digit right foot.  Plan: Discussed etiology pathology conservative surgical therapies this point in time I injected the medial aspect of her left heel 20 mg Kenalog 5 mg Marcaine start her on a Medrol Dosepak dispensed a plantar fascial brace discussed appropriate shoe gear stretching exercise ice therapy sugar modifications left foot.  This should all calm this left side down.  The right foot we will perform a chemical matricectomy on the next time she comes in for her check on her left foot.     Izzie Geers T. [place], [place]

## 2021-05-23 ENCOUNTER — Encounter: Payer: Self-pay | Admitting: Podiatry

## 2021-05-23 ENCOUNTER — Other Ambulatory Visit: Payer: Self-pay

## 2021-05-23 ENCOUNTER — Ambulatory Visit (INDEPENDENT_AMBULATORY_CARE_PROVIDER_SITE_OTHER): Payer: Medicare Other | Admitting: Podiatry

## 2021-05-23 DIAGNOSIS — L600 Ingrowing nail: Secondary | ICD-10-CM

## 2021-05-23 MED ORDER — NEOMYCIN-POLYMYXIN-HC 1 % OT SOLN: OTIC | 1 refills | Status: AC

## 2021-05-23 NOTE — Patient Instructions (Signed)

## 2021-05-23 NOTE — Progress Notes (Signed)
She presents today for follow-up of her left heel states that is really not any better Legrand Como taken a shot some discomfort continue to wear the brace.  She is also complaining about toes #2 #3 with ingrown's.  She would like for me to fix those.  Objective: Vitals are stable alert oriented x3 pain on palpation MucoClear tubercle of the left heel is present.  She is sharp and rated nail margins along the fibular borders of the second toe of the third toe of the right foot.  There is no erythema cellulitis drainage or odor from these toes only pain on palpation.  Assessment: Unresolved plan fasciitis left.  Painful ingrown's fibular border second and third toes right foot.  Plan: Discussed etiology pathology and surgical therapies at this point performed chemical matricectomy's to the fibular border of the second and third toe after local anesthetic was administered.  Tolerated procedure well.  She was given both oral and written home-going structure for the care and soaking of the toes.  We will follow-up with me in about 2 weeks.  She also received prescription for Cortisporin Otic to be applied twice daily after soaking.

## 2021-06-13 ENCOUNTER — Encounter: Payer: Self-pay | Admitting: Podiatry

## 2021-06-13 ENCOUNTER — Ambulatory Visit (INDEPENDENT_AMBULATORY_CARE_PROVIDER_SITE_OTHER): Payer: Medicare Other | Admitting: Podiatry

## 2021-06-13 ENCOUNTER — Other Ambulatory Visit: Payer: Self-pay

## 2021-06-13 DIAGNOSIS — Z9889 Other specified postprocedural states: Secondary | ICD-10-CM

## 2021-06-13 DIAGNOSIS — L600 Ingrowing nail: Secondary | ICD-10-CM

## 2021-06-13 NOTE — Progress Notes (Signed)
She presents today for follow-up of her matricectomy second and third toes of the right foot.  States that she just soaked them last night.  Denies fever chills nausea vomiting no problems whatsoever.  Objective: Vital signs stable alert oriented x3 there is no erythema edema cellulitis drainage or odor.  Toes about on to heal uneventfully.  Assessment: Well-healing surgical toes.  Plan: Continue to soak every other day for about a week or so and then discontinue follow-up with me with questions or concerns.

## 2022-03-27 ENCOUNTER — Ambulatory Visit (INDEPENDENT_AMBULATORY_CARE_PROVIDER_SITE_OTHER): Payer: Medicare Other | Admitting: Podiatry

## 2022-03-27 DIAGNOSIS — M722 Plantar fascial fibromatosis: Secondary | ICD-10-CM

## 2022-03-27 MED ORDER — TRIAMCINOLONE ACETONIDE 40 MG/ML IJ SUSP
40.0000 mg | Freq: Once | INTRAMUSCULAR | Status: AC
  Administered 2022-03-27: 40 mg

## 2022-03-27 NOTE — Progress Notes (Signed)
Jenny Reichmann presents today chief concern of pain to her bilateral heels states that she does a lot of walking having a lot of trouble with her family members at this point and feels that her heels are starting to cause her a lot of pain she feels that the heels may be contributing to of the pains into the ankles and the knee and the hip and back.  Objective: Vital signs are stable she alert and oriented x 3.  Pulses are palpable.  She has pain on palpation to medial calcaneal tubercles bilateral and pain on direct palpation on the central calcaneal tubercle.  She has some slightly diminished neurologic sensorium per Semmes Weinstein monofilament and she is not able to splay her toes as well as she has in the past.  She is also developing more hammertoe deformities leading to more reactive keratoses.  Assessment: Planter fasciitis Baxters neuritis.  Plan: At this point I injected bilateral heels with dehydrated alcohol today.  Will probably mix of dexamethasone for her next visit and we will follow-up with her at that time.

## 2022-08-14 NOTE — H&P (Signed)
Pre-Procedure H&P   Patient ID: Judy Salinas is a 66 y.o. female.  Gastroenterology Provider: Annamaria Helling, DO  Referring Provider: Stephens November, NP PCP: [facility]  Date: 08/15/2022  HPI Judy Salinas is a 66 y.o. female who presents today for Esophagogastroduodenoscopy and Colonoscopy for diarrhea, positive Cologuard (10/09/2019), chronic epigastric pain.  Patient has had diarrhea for the last 6 to 7 months.  She reports 8-9 loose bowel movements per day including nocturnal awakenings.  No melena or hematochezia.   Symptoms exacerbated by eating meat.  However, alpha gal negative.  Celiac negative.  Hemoglobin 13.4 MCV 88 platelets 278,000 ferritin 25 saturation 22% stool cultures negative O&P negative C. difficile negative  She has chronic epigastric pain with chronic BC powder NSAID use.  Was notably Cologuard positive in February 2021 but she did not pursue endoscopy at that time despite this.  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 Pt reports history of pancreatitis (2 episodes)  Remote history of EGD and colonoscopy  Brother with history of IBD and colon polyps  Past Medical History:  Diagnosis Date   Anxiety    Arthritis    hands, feet   Chronic fatigue    Fibromyalgia    s/p lyme disease   GERD (gastroesophageal reflux disease)    Hypothyroidism    IBS (irritable bowel syndrome)    Lyme disease    Seasonal affective disorder (HCC)    Shortness of breath dyspnea    from inactivity   Wears dentures    full upper, partial lower (doesn't wear lower)    Past Surgical History:  Procedure Laterality Date   ABDOMINAL HYSTERECTOMY  1984   APPENDECTOMY     BACK SURGERY     BREAST SURGERY Right    lumpectomy   FOOT SURGERY Left    x2   LAPAROSCOPIC APPENDECTOMY N/A 08/15/2017   Procedure: APPENDECTOMY LAPAROSCOPIC;  Surgeon: Olean Ree, MD;  Location: [facility];  Service: General;  Laterality: N/A;    PREAURICULAR CYST EXCISION Left 10/31/2015   Procedure: EXCISION POST AURICULAR CYST ADULT;  Surgeon: Clyde Canterbury, MD;  Location: [place];  Service: ENT;  Laterality: Left;   SEPTOPLASTY  2008   [place], Dr. Kathyrn Sheriff   SHOULDER ARTHROSCOPY WITH OPEN ROTATOR CUFF REPAIR Right 06/08/2018   Procedure: SHOULDER ARTHROSCOPY WITH MINI OPEN ROTATOR CUFF REPAIR,BICEPS TENODESIS,SUBSCAPULARIS REPAIR,SUBCROMINAL DECOMPRESSION;  Surgeon: Leim Fabry, MD;  Location: [facility];  Service: Orthopedics;  Laterality: Right;   TONSILLECTOMY N/A 10/31/2015   Procedure: TONSILLECTOMY;  Surgeon: Clyde Canterbury, MD;  Location: [place];  Service: ENT;  Laterality: N/A;    Family History Brother- ibd and colon polyps No h/o GI disease or malignancy  Review of Systems  Constitutional:  Negative for activity change, appetite change, chills, diaphoresis, fatigue, fever and unexpected weight change.  HENT:  Negative for trouble swallowing and voice change.   Respiratory:  Negative for shortness of breath and wheezing.   Cardiovascular:  Negative for chest pain, palpitations and leg swelling.  Gastrointestinal:  Positive for abdominal pain and diarrhea. Negative for abdominal distention, anal bleeding, blood in stool, constipation, nausea, rectal pain and vomiting.  Musculoskeletal:  Negative for arthralgias and myalgias.  Skin:  Negative for color change and pallor.  Neurological:  Negative for dizziness, syncope and weakness.  Psychiatric/Behavioral:  Negative for confusion.   All other systems reviewed and are negative.    Medications No current facility-administered medications on file prior to  encounter.   Current Outpatient Medications on File Prior to Encounter  Medication Sig Dispense Refill   ALPRAZolam (XANAX) 0.5 MG tablet Take 0.5 mg by mouth 2 (two) times daily.     Ascorbic Acid (VITAMIN C PO) Take by mouth.     Azelastine HCl 137 MCG/SPRAY SOLN Place into both nostrils.     B  Complex Vitamins (B COMPLEX PO) Take by mouth.     cyclobenzaprine (FLEXERIL) 10 MG tablet Take 10 mg by mouth 3 (three) times daily.     dicyclomine (BENTYL) 10 MG capsule Take 10 mg by mouth 4 (four) times daily -  before meals and at bedtime.     esomeprazole (NEXIUM) 40 MG capsule Take 40 mg by mouth daily.      ezetimibe (ZETIA) 10 MG tablet Take 10 mg by mouth at bedtime.      fluticasone (FLONASE) 50 MCG/ACT nasal spray Place 2 sprays into both nostrils daily. AM     hydrocortisone 2.5 % cream Apply topically 2 (two) times daily.     Iron-Vitamins (GERITOL PO) Take by mouth.     linaclotide (LINZESS) 290 MCG CAPS capsule Take 290 mcg by mouth daily.     MAGNESIUM PO Take 500 mg by mouth 3 (three) times daily.      montelukast (SINGULAIR) 10 MG tablet Take 10 mg by mouth at bedtime.     NEOMYCIN-POLYMYXIN-HYDROCORTISONE (CORTISPORIN) 1 % SOLN OTIC solution Apply 1-2 drops to toe BID after soaking 10 mL 1   Potassium 99 MG TABS Take 1 tablet by mouth daily.     senna-docusate (SENOKOT-S) 8.6-50 MG tablet Take by mouth 3 (three) times daily.      SYNTHROID 125 MCG tablet Take 125 mcg by mouth daily.     traMADol (ULTRAM) 50 MG tablet Take 50 mg by mouth every 6 (six) hours as needed.     traZODone (DESYREL) 100 MG tablet Take 100 mg by mouth at bedtime.     vitamin B-12 (CYANOCOBALAMIN) 500 MCG tablet Take 2,000 mcg by mouth daily.     Zinc Acetate, Oral, (ZINC ACETATE PO) Take by mouth.     promethazine (PHENERGAN) 25 MG tablet Take 25 mg by mouth every 6 (six) hours as needed.      Pertinent medications related to GI and procedure were reviewed by me with the patient prior to the procedure   Current Facility-Administered Medications:    0.9 %  sodium chloride infusion, , Intravenous, Continuous, Annamaria Helling, DO, Last Rate: 20 mL/hr at 08/15/22 0959, 1,000 mL at 08/15/22 8676      Allergies  Allergen Reactions   Amitriptyline Shortness Of Breath    And rash    Nortriptyline Shortness Of Breath    And rash   Baclofen Hives   Bactrim [Sulfamethoxazole-Trimethoprim] Hives   Claritin-D 12 Hour [Loratadine-Pseudoephedrine Er] Hives and Swelling   Codeine Hives   Diclofenac Hives   Duloxetine     Other reaction(s): Unknown   Esomeprazole Swelling    Angioedema (generic only)   Guaifenesin     Other reaction(s): Unknown   Ibuprofen Swelling    Angioedema and rash   Loratadine Hives and Swelling   Milnacipran     Other reaction(s): Other, Unknown   Penicillins Hives    Has patient had a PCN reaction causing immediate rash, facial/tongue/throat swelling, SOB or lightheadedness with hypotension: Yes Has patient had a PCN reaction causing severe rash involving mucus membranes or skin necrosis: Unknown Has  patient had a PCN reaction that required hospitalization: No Has patient had a PCN reaction occurring within the last 10 years: No If all of the above answers are "NO", then may proceed with Cephalosporin use.    Prednisone Hives   Pregabalin     Other reaction(s): Unknown, Unknown   Remeron [Mirtazapine] Hives   Sulfa Antibiotics Swelling    angioedema   Ultram [Tramadol Hcl] Swelling    Angioedema (generic only)   Vicodin [Hydrocodone-Acetaminophen] Hives   Wellbutrin [Bupropion]     Altered mental status   Zoloft [Sertraline Hcl] Hives   Neurontin [Gabapentin] Rash   Nickel Rash    Burning stinging rash- per pt highly allergic    Allergies were reviewed by me prior to the procedure  Objective   Body mass index is 22.85 kg/m. Vitals:   08/15/22 0936  BP: (!) 140/97  Pulse: 87  Resp: 18  Temp: (!) 96.9 F (36.1 C)  TempSrc: Temporal  SpO2: 98%  Weight: 68.2 kg  Height: '5\' 8"'$  (1.727 m)     Physical Exam Vitals and nursing note reviewed.  Constitutional:      General: She is not in acute distress.    Appearance: She is not ill-appearing, toxic-appearing or diaphoretic.  HENT:     Head: Normocephalic and atraumatic.      Nose: Nose normal.     Mouth/Throat:     Mouth: Mucous membranes are moist.     Pharynx: Oropharynx is clear.     Comments: dentures Eyes:     General: No scleral icterus.    Extraocular Movements: Extraocular movements intact.  Cardiovascular:     Rate and Rhythm: Normal rate and regular rhythm.     Heart sounds: Normal heart sounds. No murmur heard.    No friction rub. No gallop.  Pulmonary:     Effort: Pulmonary effort is normal. No respiratory distress.     Breath sounds: Normal breath sounds. No wheezing, rhonchi or rales.  Abdominal:     General: Bowel sounds are normal. There is no distension.     Palpations: Abdomen is soft.     Tenderness: There is no abdominal tenderness. There is no guarding or rebound.  Musculoskeletal:     Cervical back: Neck supple.     Right lower leg: No edema.     Left lower leg: No edema.  Skin:    General: Skin is warm and dry.     Coloration: Skin is not jaundiced or pale.  Neurological:     General: No focal deficit present.     Mental Status: She is alert and oriented to person, place, and time. Mental status is at baseline.  Psychiatric:        Mood and Affect: Mood normal.        Behavior: Behavior normal.        Thought Content: Thought content normal.        Judgment: Judgment normal.      Assessment:  Judy Salinas is a 66 y.o. female  who presents today for Esophagogastroduodenoscopy and Colonoscopy for diarrhea, positive Cologuard (10/09/2019), chronic epigastric pain.  Plan:  Esophagogastroduodenoscopy and Colonoscopy with possible intervention today  Esophagogastroduodenoscopy and Colonoscopy with possible biopsy, control of bleeding, polypectomy, and interventions as necessary has been discussed with the patient/patient representative. Informed consent was obtained from the patient/patient representative after explaining the indication, nature, and risks of the procedure including but not limited to death,  bleeding, perforation, missed neoplasm/lesions,  cardiorespiratory compromise, and reaction to medications. Opportunity for questions was given and appropriate answers were provided. Patient/patient representative has verbalized understanding is amenable to undergoing the procedure.   Annamaria Helling, DO  [facility]  Portions of the record may have been created with voice recognition software. Occasional wrong-word or 'sound-a-like' substitutions may have occurred due to the inherent limitations of voice recognition software.  Read the chart carefully and recognize, using context, where substitutions may have occurred.

## 2022-08-15 ENCOUNTER — Ambulatory Visit: Payer: Medicare Other | Admitting: Certified Registered"

## 2022-08-15 ENCOUNTER — Encounter: Payer: Self-pay | Admitting: Gastroenterology

## 2022-08-15 ENCOUNTER — Encounter
Admission: RE | Disposition: A | Discharge status: Home / Self Care | Payer: Self-pay | Source: Home / Self Care | Attending: Gastroenterology

## 2022-08-15 ENCOUNTER — Ambulatory Visit
Admission: RE | Admit: 2022-08-15 | Discharge: 2022-08-15 | Disposition: A | Discharge status: Home / Self Care | Payer: Medicare Other | Attending: Gastroenterology | Admitting: Gastroenterology

## 2022-08-15 DIAGNOSIS — K219 Gastro-esophageal reflux disease without esophagitis: Secondary | ICD-10-CM | POA: Insufficient documentation

## 2022-08-15 DIAGNOSIS — K259 Gastric ulcer, unspecified as acute or chronic, without hemorrhage or perforation: Secondary | ICD-10-CM | POA: Diagnosis not present

## 2022-08-15 DIAGNOSIS — R1013 Epigastric pain: Secondary | ICD-10-CM | POA: Insufficient documentation

## 2022-08-15 DIAGNOSIS — R195 Other fecal abnormalities: Secondary | ICD-10-CM | POA: Insufficient documentation

## 2022-08-15 DIAGNOSIS — K224 Dyskinesia of esophagus: Secondary | ICD-10-CM | POA: Insufficient documentation

## 2022-08-15 DIAGNOSIS — Z83719 Family history of colon polyps, unspecified: Secondary | ICD-10-CM | POA: Diagnosis not present

## 2022-08-15 DIAGNOSIS — K5730 Diverticulosis of large intestine without perforation or abscess without bleeding: Secondary | ICD-10-CM | POA: Insufficient documentation

## 2022-08-15 DIAGNOSIS — K529 Noninfective gastroenteritis and colitis, unspecified: Secondary | ICD-10-CM | POA: Diagnosis present

## 2022-08-15 DIAGNOSIS — K52831 Collagenous colitis: Secondary | ICD-10-CM | POA: Diagnosis not present

## 2022-08-15 DIAGNOSIS — K2289 Other specified disease of esophagus: Secondary | ICD-10-CM | POA: Diagnosis not present

## 2022-08-15 DIAGNOSIS — K640 First degree hemorrhoids: Secondary | ICD-10-CM | POA: Insufficient documentation

## 2022-08-15 DIAGNOSIS — K2950 Unspecified chronic gastritis without bleeding: Secondary | ICD-10-CM | POA: Diagnosis not present

## 2022-08-15 HISTORY — PX: ESOPHAGOGASTRODUODENOSCOPY (EGD) WITH PROPOFOL: SHX5813

## 2022-08-15 HISTORY — PX: COLONOSCOPY WITH PROPOFOL: SHX5780

## 2022-08-15 SURGERY — COLONOSCOPY WITH PROPOFOL
Anesthesia: General

## 2022-08-15 MED ORDER — PROPOFOL 10 MG/ML IV BOLUS
INTRAVENOUS | Status: DC | PRN
  Administered 2022-08-15 (×2): 10 mg via INTRAVENOUS
  Administered 2022-08-15: 80 mg via INTRAVENOUS
  Administered 2022-08-15: 30 mg via INTRAVENOUS
  Administered 2022-08-15 (×3): 10 mg via INTRAVENOUS

## 2022-08-15 MED ORDER — SODIUM CHLORIDE 0.9 % IV SOLN
INTRAVENOUS | Status: DC
  Administered 2022-08-15: 1000 mL via INTRAVENOUS

## 2022-08-15 MED ORDER — DEXMEDETOMIDINE HCL IN NACL 80 MCG/20ML IV SOLN
INTRAVENOUS | Status: DC | PRN
  Administered 2022-08-15: 20 ug via BUCCAL

## 2022-08-15 MED ORDER — PROPOFOL 1000 MG/100ML IV EMUL
INTRAVENOUS | Status: AC
  Filled 2022-08-15: qty 100

## 2022-08-15 MED ORDER — LIDOCAINE HCL (CARDIAC) PF 100 MG/5ML IV SOSY
PREFILLED_SYRINGE | INTRAVENOUS | Status: DC | PRN
  Administered 2022-08-15: 100 mg via INTRAVENOUS

## 2022-08-15 MED ORDER — PROPOFOL 500 MG/50ML IV EMUL
INTRAVENOUS | Status: DC | PRN
  Administered 2022-08-15: 150 ug/kg/min via INTRAVENOUS

## 2022-08-15 NOTE — Op Note (Signed)
[facility] Gastroenterology Patient Name: Judy Salinas Procedure Date: 08/15/2022 10:30 AM MRN: [id number] Account #: [id number] Date of Birth: [birth year] Admit Type: Outpatient Age: 66 Room: [facility] ENDO ROOM 1 Gender: Female Note Status: Finalized Instrument Name: Colonoscope [id number] Procedure:             Colonoscopy Indications:           Chronic diarrhea, Positive Cologuard test Providers:             Rueben Bash, DO Referring MD:          [facility] (Referring MD) Medicines:             Monitored Anesthesia Care Complications:         No immediate complications. Estimated blood loss:                         Minimal. Procedure:             Pre-Anesthesia Assessment:                        - Prior to the procedure, a History and Physical was                         performed, and patient medications and allergies were                         reviewed. The patient is competent. The risks and                         benefits of the procedure and the sedation options and                         risks were discussed with the patient. All questions                         were answered and informed consent was obtained.                         Patient identification and proposed procedure were                         verified by the physician, the nurse, the anesthetist                         and the technician in the endoscopy suite. Mental                         Status Examination: alert and oriented. Airway                         Examination: normal oropharyngeal airway and neck                         mobility. Respiratory Examination: clear to                         auscultation. CV Examination: RRR, no murmurs, no S3  or S4. Prophylactic Antibiotics: The patient does not                         require prophylactic antibiotics. Prior                         Anticoagulants: The patient has taken no  anticoagulant                         or antiplatelet agents. ASA Grade Assessment: II - A                         patient with mild systemic disease. After reviewing                         the risks and benefits, the patient was deemed in                         satisfactory condition to undergo the procedure. The                         anesthesia plan was to use monitored anesthesia care                         (MAC). Immediately prior to administration of                         medications, the patient was re-assessed for adequacy                         to receive sedatives. The heart rate, respiratory                         rate, oxygen saturations, blood pressure, adequacy of                         pulmonary ventilation, and response to care were                         monitored throughout the procedure. The physical                         status of the patient was re-assessed after the                         procedure.                        After obtaining informed consent, the colonoscope was                         passed under direct vision. Throughout the procedure,                         the patient's blood pressure, pulse, and oxygen                         saturations were monitored continuously. The  Colonoscope was introduced through the anus and                         advanced to the the terminal ileum, with                         identification of the appendiceal orifice and IC                         valve. The colonoscopy was performed without                         difficulty. The patient tolerated the procedure well.                         The quality of the bowel preparation was evaluated                         using the BBPS [facility] Bowel Preparation Scale) with                         scores of: Right Colon = 2 (minor amount of residual                         staining, small fragments of stool and/or opaque                          liquid, but mucosa seen well), Transverse Colon = 2                         (minor amount of residual staining, small fragments of                         stool and/or opaque liquid, but mucosa seen well) and                         Left Colon = 2 (minor amount of residual staining,                         small fragments of stool and/or opaque liquid, but                         mucosa seen well). The total BBPS score equals 6. The                         quality of the bowel preparation was good. The                         terminal ileum, ileocecal valve, appendiceal orifice,                         and rectum were photographed. Findings:      The perianal and digital rectal examinations were normal. Pertinent       negatives include normal sphincter tone.      The terminal ileum appeared normal. Estimated blood loss: none.      Normal mucosa was found in the entire colon.  Biopsies for histology were       taken with a cold forceps from the right colon and left colon for       evaluation of microscopic colitis. Estimated blood loss was minimal.      Multiple small-mouthed diverticula were found in the left colon.       Estimated blood loss was minimal.      Non-bleeding internal hemorrhoids were found during retroflexion. The       hemorrhoids were Grade I (internal hemorrhoids that do not prolapse).       Estimated blood loss: none.      Retroflexion in the right colon was performed.      A moderate amount of semi-solid stool was found in the entire colon,       interfering with visualization. Lavage of the area was performed using a       moderate amount, resulting in clearance with fair visualization. Solid       food residue/stool interfered with visualization. No large lesion       appreciated. Cannot rule out all small to medium sized lesions.       Estimated blood loss: none.      The exam was otherwise without abnormality on direct and retroflexion        views. Impression:            - The examined portion of the ileum was normal.                        - Normal mucosa in the entire examined colon. Biopsied.                        - Diverticulosis in the left colon.                        - Non-bleeding internal hemorrhoids.                        - Stool in the entire examined colon.                        - The examination was otherwise normal on direct and                         retroflexion views. Recommendation:        - Patient has a contact number available for                         emergencies. The signs and symptoms of potential                         delayed complications were discussed with the patient.                         Return to normal activities tomorrow. Written                         discharge instructions were provided to the patient.                        - Discharge patient to home.                        -  Resume previous diet.                        - Continue present medications.                        - Await pathology results.                        - Repeat colonoscopy for surveillance based on                         pathology results.                        - Return to GI office as previously scheduled.                        - The findings and recommendations were discussed with                         the patient. Procedure Code(s):     --- Professional ---                        [phone], Colonoscopy, flexible; with biopsy, single or                         multiple Diagnosis Code(s):     --- Professional ---                        K64.0, First degree hemorrhoids                        K52.9, Noninfective gastroenteritis and colitis,                         unspecified                        R19.5, Other fecal abnormalities                        K57.30, Diverticulosis of large intestine without                         perforation or abscess without bleeding CPT copyright 2022 American Medical  Association. All rights reserved. The codes documented in this report are preliminary and upon coder review may  be revised to meet current compliance requirements. Attending Participation:      I personally performed the entire procedure. Volney American, DO Annamaria Helling DO, DO 08/15/2022 11:38:59 AM This report has been signed electronically. Number of Addenda: 0 Note Initiated On: 08/15/2022 10:30 AM Scope Withdrawal Time: 0 hours 17 minutes 53 seconds  Total Procedure Duration: 0 hours 31 minutes 1 second  Estimated Blood Loss:  Estimated blood loss was minimal.      [facility]

## 2022-08-15 NOTE — Anesthesia Procedure Notes (Signed)
Procedure Name: MAC Date/Time: 08/15/2022 10:33 AM  Performed by: Biagio Borg, CRNAPre-anesthesia Checklist: Patient identified, Emergency Drugs available, Suction available, Patient being monitored and Timeout performed Patient Re-evaluated:Patient Re-evaluated prior to induction Oxygen Delivery Method: Nasal cannula Induction Type: IV induction Placement Confirmation: positive ETCO2 and CO2 detector

## 2022-08-15 NOTE — Transfer of Care (Signed)
Immediate Anesthesia Transfer of Care Note  Patient: Judy Salinas  Procedure(s) Performed: COLONOSCOPY WITH PROPOFOL ESOPHAGOGASTRODUODENOSCOPY (EGD) WITH PROPOFOL  Patient Location: PACU and Endoscopy Unit  Anesthesia Type:General  Level of Consciousness: awake  Airway & Oxygen Therapy: Patient Spontanous Breathing  Post-op Assessment: Report given to RN and Post -op Vital signs reviewed and stable  Post vital signs: Reviewed and stable  Last Vitals:  Vitals Value Taken Time  BP 142/83 08/15/22 1126  Temp 35.8 C 08/15/22 1125  Pulse 71 08/15/22 1125  Resp 15 08/15/22 1126  SpO2 98 % 08/15/22 1125  Vitals shown include unvalidated device data.  Last Pain:  Vitals:   08/15/22 1125  TempSrc: Temporal  PainSc: Asleep         Complications: No notable events documented.

## 2022-08-15 NOTE — Anesthesia Preprocedure Evaluation (Signed)
Anesthesia Evaluation  Patient identified by MRN, date of birth, ID band Patient awake    Reviewed: Allergy & Precautions, NPO status , Patient's Chart, lab work & pertinent test results  Airway Mallampati: III  TM Distance: >3 FB Neck ROM: full    Dental no notable dental hx.    Pulmonary COPD, Patient abstained from smoking.   Pulmonary exam normal        Cardiovascular negative cardio ROS Normal cardiovascular exam     Neuro/Psych  PSYCHIATRIC DISORDERS Anxiety Depression    negative neurological ROS     GI/Hepatic Neg liver ROS,GERD  ,,  Endo/Other  Hypothyroidism    Renal/GU negative Renal ROS  negative genitourinary   Musculoskeletal  (+) Arthritis ,  Fibromyalgia -, narcotic dependent  Abdominal   Peds  Hematology negative hematology ROS (+)   Anesthesia Other Findings Past Medical History: No date: Anxiety No date: Arthritis     Comment:  hands, feet No date: Chronic fatigue No date: Fibromyalgia     Comment:  s/p lyme disease No date: GERD (gastroesophageal reflux disease) No date: Hypothyroidism No date: IBS (irritable bowel syndrome) No date: Lyme disease No date: Seasonal affective disorder (HCC) No date: Shortness of breath dyspnea     Comment:  from inactivity No date: Wears dentures     Comment:  full upper, partial lower (doesn't wear lower)  Past Surgical History: 1984: ABDOMINAL HYSTERECTOMY No date: APPENDECTOMY No date: BACK SURGERY No date: BREAST SURGERY; Right     Comment:  lumpectomy No date: FOOT SURGERY; Left     Comment:  x2 08/15/2017: LAPAROSCOPIC APPENDECTOMY; N/A     Comment:  Procedure: APPENDECTOMY LAPAROSCOPIC;  Surgeon: Olean Ree, MD;  Location: [facility];  Service: General;                Laterality: N/A; 10/31/2015: PREAURICULAR CYST EXCISION; Left     Comment:  Procedure: EXCISION POST AURICULAR CYST ADULT;  Surgeon:              Clyde Canterbury,  MD;  Location: [place];                Service: ENT;  Laterality: Left; 2008: SEPTOPLASTY     Comment:  [facility], Dr. Kathyrn Sheriff 06/08/2018: SHOULDER ARTHROSCOPY WITH OPEN ROTATOR CUFF REPAIR; Right     Comment:  Procedure: SHOULDER ARTHROSCOPY WITH MINI OPEN ROTATOR               CUFF REPAIR,BICEPS TENODESIS,SUBSCAPULARIS               REPAIR,SUBCROMINAL DECOMPRESSION;  Surgeon: Leim Fabry,              MD;  Location: [facility];  Service: Orthopedics;                Laterality: Right; 10/31/2015: TONSILLECTOMY; N/A     Comment:  Procedure: TONSILLECTOMY;  Surgeon: Clyde Canterbury, MD;                Location: [place];  Service: ENT;                Laterality: N/A;     Reproductive/Obstetrics negative OB ROS                             Anesthesia Physical Anesthesia Plan  ASA: 2  Anesthesia Plan:  General   Post-op Pain Management: Minimal or no pain anticipated   Induction: Intravenous  PONV Risk Score and Plan: Propofol infusion and TIVA  Airway Management Planned: Natural Airway and Nasal Cannula  Additional Equipment:   Intra-op Plan:   Post-operative Plan:   Informed Consent: I have reviewed the patients History and Physical, chart, labs and discussed the procedure including the risks, benefits and alternatives for the proposed anesthesia with the patient or authorized representative who has indicated his/her understanding and acceptance.     Dental Advisory Given  Plan Discussed with: Anesthesiologist, CRNA and Surgeon  Anesthesia Plan Comments: (Patient consented for risks of anesthesia including but not limited to:  - adverse reactions to medications - risk of airway placement if required - damage to eyes, teeth, lips or other oral mucosa - nerve damage due to positioning  - sore throat or hoarseness - Damage to heart, brain, nerves, lungs, other parts of body or loss of life  Patient voiced understanding.)        Anesthesia Quick Evaluation

## 2022-08-15 NOTE — Anesthesia Postprocedure Evaluation (Signed)
Anesthesia Post Note  Patient: Judy Salinas  Procedure(s) Performed: COLONOSCOPY WITH PROPOFOL ESOPHAGOGASTRODUODENOSCOPY (EGD) WITH PROPOFOL  Patient location during evaluation: Endoscopy Anesthesia Type: General Level of consciousness: awake and alert Pain management: pain level controlled Vital Signs Assessment: post-procedure vital signs reviewed and stable Respiratory status: spontaneous breathing, nonlabored ventilation, respiratory function stable and patient connected to nasal cannula oxygen Cardiovascular status: blood pressure returned to baseline and stable Postop Assessment: no apparent nausea or vomiting Anesthetic complications: no  No notable events documented.   Last Vitals:  Vitals:   08/15/22 1135 08/15/22 1145  BP: (!) 137/90 (!) 158/88  Pulse: 70 66  Resp:    Temp:    SpO2: 99% 99%    Last Pain:  Vitals:   08/15/22 1145  TempSrc:   PainSc: 0-No pain                 Ilene Qua

## 2022-08-15 NOTE — Interval H&P Note (Signed)
History and Physical Interval Note: Preprocedure H&P from 08/15/22  was reviewed and there was no interval change after seeing and examining the patient.  Written consent was obtained from the patient after discussion of risks, benefits, and alternatives. Patient has consented to proceed with Esophagogastroduodenoscopy and Colonoscopy with possible intervention   08/15/2022 10:29 AM  Judy Salinas  has presented today for surgery, with the diagnosis of R10.84  - Generalized abdominal pain R10.13  - Dyspepsia R19.7  - Diarrhea, unspecified type.  The various methods of treatment have been discussed with the patient and family. After consideration of risks, benefits and other options for treatment, the patient has consented to  Procedure(s): COLONOSCOPY WITH PROPOFOL (N/A) ESOPHAGOGASTRODUODENOSCOPY (EGD) WITH PROPOFOL (N/A) as a surgical intervention.  The patient's history has been reviewed, patient examined, no change in status, stable for surgery.  I have reviewed the patient's chart and labs.  Questions were answered to the patient's satisfaction.     Annamaria Helling

## 2022-08-15 NOTE — Anesthesia Procedure Notes (Signed)
Procedure Name: MAC Date/Time: 08/15/2022 10:33 AM  Performed by: Zainab Crumrine L, CRNAPre-anesthesia Checklist: Patient identified, Emergency Drugs available, Suction available, Patient being monitored and Timeout performed Patient Re-evaluated:Patient Re-evaluated prior to induction Oxygen Delivery Method: Nasal cannula Induction Type: IV induction Placement Confirmation: positive ETCO2 and CO2 detector      

## 2022-08-15 NOTE — Op Note (Signed)
[facility] Gastroenterology Patient Name: Judy Salinas Procedure Date: 08/15/2022 10:30 AM MRN: [id number] Account #: [id number] Date of Birth: 11-06-1955 Admit Type: Outpatient Age: 66 Room: [facility] ENDO ROOM 1 Gender: Female Note Status: Finalized Instrument Name: Upper Endoscope [id number] Procedure:             Upper GI endoscopy Indications:           Epigastric abdominal pain, Generalized abdominal pain,                         Diarrhea Providers:             Rueben Bash, DO Referring MD:          [facility] (Referring MD) Medicines:             Monitored Anesthesia Care Complications:         No immediate complications. Estimated blood loss:                         Minimal. Procedure:             Pre-Anesthesia Assessment:                        - Prior to the procedure, a History and Physical was                         performed, and patient medications and allergies were                         reviewed. The patient is competent. The risks and                         benefits of the procedure and the sedation options and                         risks were discussed with the patient. All questions                         were answered and informed consent was obtained.                         Patient identification and proposed procedure were                         verified by the physician, the nurse, the anesthetist                         and the technician in the endoscopy suite. Mental                         Status Examination: alert and oriented. Airway                         Examination: normal oropharyngeal airway and neck                         mobility. Respiratory Examination: clear to  auscultation. CV Examination: RRR, no murmurs, no S3                         or S4. Prophylactic Antibiotics: The patient does not                         require prophylactic antibiotics. Prior                          Anticoagulants: The patient has taken no anticoagulant                         or antiplatelet agents. ASA Grade Assessment: II - A                         patient with mild systemic disease. After reviewing                         the risks and benefits, the patient was deemed in                         satisfactory condition to undergo the procedure. The                         anesthesia plan was to use monitored anesthesia care                         (MAC). Immediately prior to administration of                         medications, the patient was re-assessed for adequacy                         to receive sedatives. The heart rate, respiratory                         rate, oxygen saturations, blood pressure, adequacy of                         pulmonary ventilation, and response to care were                         monitored throughout the procedure. The physical                         status of the patient was re-assessed after the                         procedure.                        After obtaining informed consent, the endoscope was                         passed under direct vision. Throughout the procedure,                         the patient's blood pressure, pulse, and oxygen  saturations were monitored continuously. The Endoscope                         was introduced through the mouth, and advanced to the                         second part of duodenum. The upper GI endoscopy was                         accomplished without difficulty. The patient tolerated                         the procedure well. Findings:      The duodenal bulb, first portion of the duodenum and second portion of       the duodenum were normal. Biopsies for histology were taken with a cold       forceps for evaluation of celiac disease. Estimated blood loss was       minimal.      Scattered moderate inflammation characterized by erythema was found in        the entire examined stomach. Biopsies were taken with a cold forceps for       Helicobacter pylori testing. Estimated blood loss was minimal.      One non-bleeding superficial gastric ulcer with no stigmata of bleeding       was found in the gastric antrum. The lesion was appeared to be healing       vs forming- no ulcer base mm in largest dimension. Biopsies were taken       with a cold forceps for histology. Patulous pylorus. Estimated blood       loss was minimal.      The Z-line was irregular and was found ~1cm tongue of salmon colored       mucosa above z line. Biopsies were taken with a cold forceps for       histology. Estimated blood loss was minimal.      Esophagogastric landmarks were identified: the gastroesophageal junction       was found at 37 cm from the incisors.      Abnormal motility was noted in the esophagus. The cricopharyngeus was       normal. There is spasticity of the esophageal body. The distal       esophagus/lower esophageal sphincter is open. Tertiary peristaltic waves       are noted.      Normal mucosa was found in the entire esophagus. Biopsies were taken       with a cold forceps for histology. mid esophagus for eoe Estimated blood       loss was minimal.      The exam of the esophagus was otherwise normal. Impression:            - Normal duodenal bulb, first portion of the duodenum                         and second portion of the duodenum. Biopsied.                        - Gastritis. Biopsied.                        - Non-bleeding gastric ulcer with  no stigmata of                         bleeding. Biopsied.                        - Z-line irregular, ~1cm tongue of salmon colored                         mucosa above z line. Biopsied.                        - Esophagogastric landmarks identified.                        - Abnormal esophageal motility, consistent with                         presbyesophagus.                        - Normal mucosa was  found in the entire esophagus.                         Biopsied. Recommendation:        - Patient has a contact number available for                         emergencies. The signs and symptoms of potential                         delayed complications were discussed with the patient.                         Return to normal activities tomorrow. Written                         discharge instructions were provided to the patient.                        - Discharge patient to home.                        - Resume previous diet.                        - Continue present medications.                        - Add acid suppressive therapy to prevent ulcer                         formation if not already taking.                        - No ibuprofen, naproxen, or other non-steroidal                         anti-inflammatory drugs this includes BC and Goody                         powders.                        -  Await pathology results.                        - Return to GI clinic as previously scheduled.                        - The findings and recommendations were discussed with                         the patient.                        - Consider workup for pancreatic insufficiency given                         history of pancreatitis Procedure Code(s):     --- Professional ---                        [phone], Esophagogastroduodenoscopy, flexible,                         transoral; with biopsy, single or multiple Diagnosis Code(s):     --- Professional ---                        K29.70, Gastritis, unspecified, without bleeding                        K25.9, Gastric ulcer, unspecified as acute or chronic,                         without hemorrhage or perforation                        K22.89, Other specified disease of esophagus                        K22.4, Dyskinesia of esophagus                        R10.13, Epigastric pain                        R10.84, Generalized abdominal pain                         R19.7, Diarrhea, unspecified CPT copyright 2022 American Medical Association. All rights reserved. The codes documented in this report are preliminary and upon coder review may  be revised to meet current compliance requirements. Attending Participation:      I personally performed the entire procedure. Volney American, DO Annamaria Helling DO, DO 08/15/2022 11:29:29 AM This report has been signed electronically. Number of Addenda: 0 Note Initiated On: 08/15/2022 10:30 AM Estimated Blood Loss:  Estimated blood loss was minimal.      [facility]

## 2022-08-16 ENCOUNTER — Encounter: Payer: Self-pay | Admitting: Gastroenterology

## 2022-08-16 LAB — SURGICAL PATHOLOGY

## 2022-09-02 ENCOUNTER — Encounter: Payer: Self-pay | Admitting: Gastroenterology

## 2024-07-23 ENCOUNTER — Other Ambulatory Visit: Payer: Self-pay | Admitting: Orthopedic Surgery

## 2024-07-23 DIAGNOSIS — M545 Low back pain, unspecified: Secondary | ICD-10-CM

## 2024-07-28 ENCOUNTER — Ambulatory Visit: Admission: RE | Admit: 2024-07-28 | Discharge: 2024-07-28 | Attending: Orthopedic Surgery

## 2024-07-28 DIAGNOSIS — M545 Low back pain, unspecified: Secondary | ICD-10-CM | POA: Insufficient documentation

## 2024-09-17 NOTE — Progress Notes (Unsigned)
 "  Referring Physician:  Dodson Delon FERNS, MD [address] [place],  [place] [address]  Primary Physician:  [place]  History of Present Illness: 09/17/2024*** [name] has a history of hiatal hernia, emphysema, GERD, IBS, acquired hypothyroidism, FM, chronic pain syndrome, chronic fatigue, anxiety, hyperlipidemia, lyme disease.   History of lumbar surgery?***  Saw Dr. Dodson on 08/06/24- was not interested in PT or injections and was referred here.      Back pain Bilateral legs pain Numbness, tingling and weakness in the spine  Has seen Neurosurgeon in [place] 20 years ago per patient  Duration: *** Location: *** Quality: *** Severity: ***  Precipitating: aggravated by *** Modifying factors: made better by *** Weakness: none Timing: ***  Tobacco use: smokes 1 PPD x 40*** years.  Bowel/Bladder Dysfunction: none  Conservative measures: yoga Physical therapy: *** has not participated in recently-does HEP at home recently Multimodal medical therapy including regular antiinflammatories: *** Flexeril , Tramadol  Injections: *** no ESI injections-she has had injections in the shoulder (allergic to Prednisone/ all steroids per patient)  Past Surgery: ***back surgery 2008?-where and type?  Judy Salinas has ***no symptoms of cervical myelopathy.  The symptoms are causing a significant impact on the patient's life.   Review of Systems:  A 10 point review of systems is negative, except for the pertinent positives and negatives detailed in the HPI.  Past Medical History: Past Medical History:  Diagnosis Date   Anxiety    Arthritis    hands, feet   Chronic fatigue    Fibromyalgia    s/p lyme disease   GERD (gastroesophageal reflux disease)    Hypothyroidism    IBS (irritable bowel syndrome)    Lyme disease    Seasonal affective disorder    Shortness of breath dyspnea    from inactivity   Wears dentures    full  upper, partial lower (doesn't wear lower)    Past Surgical History: Past Surgical History:  Procedure Laterality Date   ABDOMINAL HYSTERECTOMY  1984   APPENDECTOMY     BACK SURGERY     BREAST SURGERY Right    lumpectomy   COLONOSCOPY WITH PROPOFOL  N/A 08/15/2022   Procedure: COLONOSCOPY WITH PROPOFOL ;  Surgeon: Onita Elspeth Sharper, DO;  Location: [facility] ENDOSCOPY;  Service: Gastroenterology;  Laterality: N/A;   ESOPHAGOGASTRODUODENOSCOPY (EGD) WITH PROPOFOL  N/A 08/15/2022   Procedure: ESOPHAGOGASTRODUODENOSCOPY (EGD) WITH PROPOFOL ;  Surgeon: Onita Elspeth Sharper, DO;  Location: [facility] ENDOSCOPY;  Service: Gastroenterology;  Laterality: N/A;   FOOT SURGERY Left    x2   LAPAROSCOPIC APPENDECTOMY N/A 08/15/2017   Procedure: APPENDECTOMY LAPAROSCOPIC;  Surgeon: Desiderio Schanz, MD;  Location: [facility] ORS;  Service: General;  Laterality: N/A;   PREAURICULAR CYST EXCISION Left 10/31/2015   Procedure: EXCISION POST AURICULAR CYST ADULT;  Surgeon: Deward Dolly, MD;  Location: [facility]ERY [facility];  Service: ENT;  Laterality: Left;   SEPTOPLASTY  2008   [facility], Dr. Edda   SHOULDER ARTHROSCOPY WITH OPEN ROTATOR CUFF REPAIR Right 06/08/2018   Procedure: SHOULDER ARTHROSCOPY WITH MINI OPEN ROTATOR CUFF REPAIR,BICEPS TENODESIS,SUBSCAPULARIS REPAIR,SUBCROMINAL DECOMPRESSION;  Surgeon: Tobie Priest, MD;  Location: [facility] ORS;  Service: Orthopedics;  Laterality: Right;   TONSILLECTOMY N/A 10/31/2015   Procedure: TONSILLECTOMY;  Surgeon: Deward Dolly, MD;  Location: [facility];  Service: ENT;  Laterality: N/A;    Allergies: Allergies as of 09/27/2024 - Review Complete 08/15/2022  Allergen Reaction Noted   Amitriptyline Shortness Of Breath 10/23/2015   Nortriptyline Shortness Of  Breath 10/23/2015   Baclofen Hives 10/23/2015   Bactrim [sulfamethoxazole-trimethoprim] Hives 10/23/2015   Claritin-d 12 hour [loratadine-pseudoephedrine er] Hives and Swelling 11/12/2017   Codeine  Hives 10/23/2015    Diclofenac Hives 11/12/2017   Duloxetine  11/05/2018   Esomeprazole Swelling 10/23/2015   Guaifenesin  11/05/2018   Ibuprofen Swelling 10/23/2015   Loratadine Hives and Swelling 11/12/2017   Milnacipran  11/05/2018   Penicillins Hives 10/23/2015   Prednisone Hives 05/23/2021   Pregabalin  11/05/2018   Remeron [mirtazapine] Hives 10/23/2015   Sulfa antibiotics Swelling 10/23/2015   Ultram  [tramadol  hcl] Swelling 10/23/2015   Vicodin [hydrocodone-acetaminophen ] Hives 10/23/2015   Wellbutrin [bupropion]  10/23/2015   Zoloft [sertraline hcl] Hives 10/23/2015   Neurontin [gabapentin] Rash 10/23/2015   Nickel Rash 06/08/2018    Medications: Outpatient Encounter Medications as of 09/27/2024  Medication Sig   ALPRAZolam  (XANAX ) 0.5 MG tablet Take 0.5 mg by mouth 2 (two) times daily.   Ascorbic Acid (VITAMIN C PO) Take by mouth.   Azelastine HCl 137 MCG/SPRAY SOLN Place into both nostrils.   B Complex Vitamins (B COMPLEX PO) Take by mouth.   cyclobenzaprine  (FLEXERIL ) 10 MG tablet Take 10 mg by mouth 3 (three) times daily.   dicyclomine (BENTYL) 10 MG capsule Take 10 mg by mouth 4 (four) times daily -  before meals and at bedtime.   esomeprazole (NEXIUM) 40 MG capsule Take 40 mg by mouth daily.    ezetimibe  (ZETIA ) 10 MG tablet Take 10 mg by mouth at bedtime.    fluticasone  (FLONASE ) 50 MCG/ACT nasal spray Place 2 sprays into both nostrils daily. AM   hydrocortisone 2.5 % cream Apply topically 2 (two) times daily.   Iron-Vitamins (GERITOL PO) Take by mouth.   linaclotide  (LINZESS ) 290 MCG CAPS capsule Take 290 mcg by mouth daily.   MAGNESIUM PO Take 500 mg by mouth 3 (three) times daily.    montelukast  (SINGULAIR ) 10 MG tablet Take 10 mg by mouth at bedtime.   NEOMYCIN -POLYMYXIN-HYDROCORTISONE (CORTISPORIN) 1 % SOLN OTIC solution Apply 1-2 drops to toe BID after soaking   Potassium 99 MG TABS Take 1 tablet by mouth daily.   promethazine  (PHENERGAN ) 25 MG tablet Take 25 mg by mouth every 6  (six) hours as needed.   senna-docusate (SENOKOT-S) 8.6-50 MG tablet Take by mouth 3 (three) times daily.    SYNTHROID  125 MCG tablet Take 125 mcg by mouth daily.   traMADol  (ULTRAM ) 50 MG tablet Take 50 mg by mouth every 6 (six) hours as needed.   traZODone  (DESYREL ) 100 MG tablet Take 100 mg by mouth at bedtime.   vitamin B-12 (CYANOCOBALAMIN) 500 MCG tablet Take 2,000 mcg by mouth daily.   Zinc Acetate, Oral, (ZINC ACETATE PO) Take by mouth.   No facility-administered encounter medications on file as of 09/27/2024.    Social History: Social History[1]  Family Medical History: Family History  Problem Relation Age of Onset   Cancer Mother    Arthritis Father    Heart disease Father    Diabetes Father    Cancer Sister    Cancer Brother     Physical Examination: There were no vitals filed for this visit.  General: Patient is well developed, well nourished, calm, collected, and in no apparent distress. Attention to examination is appropriate.  Respiratory: Patient is breathing without any difficulty.   NEUROLOGICAL:     Awake, alert, oriented to person, place, and time.  Speech is clear and fluent. Fund of knowledge is appropriate.  Cranial Nerves: Pupils equal round and reactive to light.  Facial tone is symmetric.    *** ROM of cervical spine *** pain *** posterior cervical tenderness. *** tenderness in bilateral trapezial region.   *** ROM of lumbar spine *** pain *** posterior lumbar tenderness.   No abnormal lesions on exposed skin.   Strength: Side Biceps Triceps Deltoid Interossei Grip Wrist Ext. Wrist Flex.  R 5 5 5 5 5 5 5   L 5 5 5 5 5 5 5    Side Iliopsoas Quads Hamstring PF DF EHL  R 5 5 5 5 5 5   L 5 5 5 5 5 5    Reflexes are ***2+ and symmetric at the biceps, brachioradialis, patella and achilles.   Hoffman's is absent.  Clonus is not present.   Bilateral upper and lower extremity sensation is intact to light touch.     Gait is normal.   ***No  difficulty with tandem gait.    Medical Decision Making  Imaging: Lumbar MRI scan dated 07/28/24:  FINDINGS: Segmentation:  Standard.   Alignment:  Physiologic.   Vertebrae: No acute fracture, evidence of discitis, or aggressive bone lesion.   Conus medullaris and cauda equina: Conus extends to the L1 level. Conus and cauda equina appear normal.   Paraspinal and other soft tissues: No acute paraspinal abnormality.   Disc levels:   Disc spaces: Mild disc height loss at L2-3, L3-4, L4-5 and L5-S1.   T12-L1: No significant disc bulge. No neural foraminal stenosis. No central canal stenosis.   L1-L2: Mild broad-based disc bulge. Mild bilateral facet arthropathy. No foraminal or central canal stenosis.   L2-L3: Mild broad-based disc bulge. Mild bilateral facet arthropathy. No foraminal or central canal stenosis.   L3-L4: Moderate disc bulge. Mild bilateral facet arthropathy with ligamentum flavum thickening. Moderate-severe central canal stenosis. Moderate right and mild left foraminal stenosis.   L4-L5: Mild disc bulge eccentric towards the left. Moderate bilateral facet arthropathy. Left laminectomy. Mild left lateral recess stenosis. No central canal stenosis. Severe left foraminal stenosis. Mild right foraminal stenosis.   L5-S1: Mild disc bulge eccentric towards the left. Mild bilateral facet arthropathy there is moderate right and moderate-severe left foraminal stenosis. No central canal stenosis.   IMPRESSION: 1. At L3-4 there is a moderate disc bulge. Mild bilateral facet arthropathy with ligamentum flavum thickening. Moderate-severe central canal stenosis. Moderate right and mild left foraminal stenosis. 2. At L4-5 there is a mild disc bulge eccentric towards the left. Moderate bilateral facet arthropathy. Left laminectomy. Mild left lateral recess stenosis. Severe left foraminal stenosis. Mild right foraminal stenosis. 3. At L5-S1 there is a mild disc bulge  eccentric towards the left. Mild bilateral facet arthropathy there is moderate right and moderate-severe left foraminal stenosis. 4.  No acute osseous injury of the lumbar spine.     Electronically Signed   By: Julaine Blanch M.D.   On: 07/30/2024 16:31    Lumbar xrays dated 07/22/24:  FINDINGS:  Incidentally noted 2.5 cm round calcified structure in the right  hemipelvis.  No aggressive osseus lesion.  Vertebral body heights are intact.  Multilevel moderate disc height loss with associated endplate irregularity  and anterior osteophyte formation.  This is greatest at L4-5.  Straightening of normal lordosis, likely degenerative.  Impression  1.  Multilevel moderate degenerative disc disease, greatest at L4-5. 2.  Incidentally noted 2.5 cm round calcific density in the right hemipelvis.  This is of uncertain etiology and may be related to ingested material or  potentially something overlying the patient.  BRENDAN CHRISTOPHER CLINE, MD    I have personally reviewed the images and agree with the above interpretation.  Assessment and Plan: [name] is a pleasant 69 y.o. female has ***  Treatment options discussed with patient and following plan made:   - Order for physical therapy for *** spine ***. Patient to call to schedule appointment. *** - Continue current medications including ***. Reviewed dosing and side effects.  - Prescription for ***. Reviewed dosing and side effects. Take with food.  - Prescription for *** to take prn muscle spasms. Reviewed dosing and side effects. Discussed this can cause drowsiness.  - MRI of *** to further evaluate *** radiculopathy. No improvement time or medications (***).  - Referral to [facility] at [facility] to discuss possible *** injections.  - Will schedule phone visit to review MRI results once I get them back.   I spent a total of *** minutes in face-to-face and non-face-to-face activities related to this patient's care today including review of  outside records, review of imaging, review of symptoms, physical exam, discussion of differential diagnosis, discussion of treatment options, and documentation.   Thank you for involving me in the care of this patient.   Glade Boys PA-C Dept. of Neurosurgery      [1]  Social History Tobacco Use   Smoking status: Every Day    Current packs/day: 1.00    Average packs/day: 1 pack/day for 40.0 years (40.0 ttl pk-yrs)    Types: Cigarettes   Smokeless tobacco: Never  Vaping Use   Vaping status: Former  Substance Use Topics   Alcohol use: Yes    Comment: occ   Drug use: No    Comment: CBD Vape   "

## 2024-09-23 ENCOUNTER — Inpatient Hospital Stay
Admission: RE | Admit: 2024-09-23 | Discharge: 2024-09-23 | Disposition: A | Payer: Self-pay | Source: Ambulatory Visit | Attending: Orthopedic Surgery | Admitting: Orthopedic Surgery

## 2024-09-23 ENCOUNTER — Other Ambulatory Visit: Payer: Self-pay

## 2024-09-23 DIAGNOSIS — Z049 Encounter for examination and observation for unspecified reason: Secondary | ICD-10-CM

## 2024-09-27 ENCOUNTER — Ambulatory Visit: Admitting: Orthopedic Surgery
